# Patient Record
Sex: Male | Born: 1970 | Race: Black or African American | Hispanic: No | Marital: Married | Smoking: Current every day smoker
Health system: Southern US, Community
[De-identification: ages and names within clinical notes are randomized; demographics above are authoritative.]

## PROBLEM LIST (undated history)

## (undated) DIAGNOSIS — N200 Calculus of kidney: Secondary | ICD-10-CM

## (undated) DIAGNOSIS — N400 Enlarged prostate without lower urinary tract symptoms: Secondary | ICD-10-CM

## (undated) HISTORY — PX: KNEE ARTHROSCOPY: SUR90

---

## 2010-04-28 ENCOUNTER — Emergency Department (HOSPITAL_COMMUNITY)
Admission: EM | Admit: 2010-04-28 | Discharge: 2010-04-28 | Disposition: A | Discharge status: Home / Self Care | Payer: Self-pay | Attending: Emergency Medicine | Admitting: Emergency Medicine

## 2010-04-28 DIAGNOSIS — R209 Unspecified disturbances of skin sensation: Secondary | ICD-10-CM | POA: Insufficient documentation

## 2010-04-28 DIAGNOSIS — M62838 Other muscle spasm: Secondary | ICD-10-CM | POA: Insufficient documentation

## 2010-04-28 DIAGNOSIS — M542 Cervicalgia: Secondary | ICD-10-CM | POA: Insufficient documentation

## 2010-04-28 DIAGNOSIS — M25519 Pain in unspecified shoulder: Secondary | ICD-10-CM | POA: Insufficient documentation

## 2011-10-21 ENCOUNTER — Encounter (HOSPITAL_COMMUNITY): Payer: Self-pay

## 2011-10-21 ENCOUNTER — Emergency Department (HOSPITAL_COMMUNITY)
Admission: EM | Admit: 2011-10-21 | Discharge: 2011-10-21 | Disposition: A | Discharge status: Home / Self Care | Payer: Self-pay | Attending: Emergency Medicine | Admitting: Emergency Medicine

## 2011-10-21 DIAGNOSIS — B349 Viral infection, unspecified: Secondary | ICD-10-CM

## 2011-10-21 DIAGNOSIS — F17200 Nicotine dependence, unspecified, uncomplicated: Secondary | ICD-10-CM | POA: Insufficient documentation

## 2011-10-21 DIAGNOSIS — B9789 Other viral agents as the cause of diseases classified elsewhere: Secondary | ICD-10-CM | POA: Insufficient documentation

## 2011-10-21 LAB — CBC WITH DIFFERENTIAL/PLATELET
Basophils Relative: 0 % (ref 0–1)
Eosinophils Absolute: 0 10*3/uL (ref 0.0–0.7)
MCH: 30.8 pg (ref 26.0–34.0)
MCHC: 33.6 g/dL (ref 30.0–36.0)
Neutrophils Relative %: 83 % — ABNORMAL HIGH (ref 43–77)
Platelets: 180 10*3/uL (ref 150–400)
RBC: 4.32 MIL/uL (ref 4.22–5.81)

## 2011-10-21 MED ORDER — KETOROLAC TROMETHAMINE 30 MG/ML IJ SOLN
30.0000 mg | Freq: Once | INTRAMUSCULAR | Status: AC
  Administered 2011-10-21: 30 mg via INTRAVENOUS
  Filled 2011-10-21: qty 1

## 2011-10-21 MED ORDER — ONDANSETRON HCL 4 MG/2ML IJ SOLN
4.0000 mg | Freq: Once | INTRAMUSCULAR | Status: AC
  Administered 2011-10-21: 4 mg via INTRAVENOUS
  Filled 2011-10-21: qty 2

## 2011-10-21 MED ORDER — SODIUM CHLORIDE 0.9 % IV BOLUS (SEPSIS)
500.0000 mL | Freq: Once | INTRAVENOUS | Status: AC
  Administered 2011-10-21: 500 mL via INTRAVENOUS

## 2011-10-21 MED ORDER — PROMETHAZINE HCL 25 MG PO TABS: 25.0000 mg | ORAL_TABLET | Freq: Four times a day (QID) | ORAL | Status: DC | PRN

## 2011-10-21 MED ORDER — MORPHINE SULFATE 4 MG/ML IJ SOLN
4.0000 mg | Freq: Once | INTRAMUSCULAR | Status: AC
  Administered 2011-10-21: 4 mg via INTRAVENOUS
  Filled 2011-10-21: qty 1

## 2011-10-21 MED ORDER — OXYCODONE-ACETAMINOPHEN 5-325 MG PO TABS: 1.0000 | ORAL_TABLET | Freq: Four times a day (QID) | ORAL | Status: AC | PRN

## 2011-10-21 MED ORDER — SODIUM CHLORIDE 0.9 % IV BOLUS (SEPSIS)
1000.0000 mL | Freq: Once | INTRAVENOUS | Status: AC
  Administered 2011-10-21: 1000 mL via INTRAVENOUS

## 2011-10-21 NOTE — ED Notes (Signed)
Pt presents to ED with body aches, fever, chills and sore throat x 2 days.

## 2011-10-21 NOTE — ED Notes (Signed)
Pt d/c home in NAD. Pt voiced understanding of d/c instructions and follow up care. Pt instructed not to drive after taking phenergan/percocet.

## 2011-10-21 NOTE — ED Provider Notes (Signed)
History     CSN: [id number]  Arrival date & time 10/21/11  0820   First MD Initiated Contact with Patient 10/21/11 0840      Chief Complaint  Patient presents with  . Fever    (Consider location/radiation/quality/duration/timing/severity/associated sxs/prior treatment) Patient is a 41 y.o. male presenting with fever. The history is provided by the patient.  Fever Primary symptoms of the febrile illness include fever, fatigue and myalgias. Primary symptoms do not include headaches, shortness of breath, abdominal pain, nausea, vomiting, diarrhea or rash.  The myalgias are not associated with weakness.   patient has had fevers chills sore throat and myalgias for the last 2 days. He's had a decreased appetite. He states that he feels weak all over. He states he has vomited a little bit of blood. No clear sick contacts. No other bleeding. He has no diarrhea. He states he's been a little lightheaded. No urinary frequency.  History reviewed. No pertinent past medical history.  History reviewed. No pertinent past surgical history.  History reviewed. No pertinent family history.  History  Substance Use Topics  . Smoking status: Current Everyday Smoker  . Smokeless tobacco: Not on file  . Alcohol Use: Yes      Review of Systems  Constitutional: Positive for fever, activity change, appetite change and fatigue.  HENT: Positive for sore throat. Negative for neck stiffness.   Eyes: Negative for pain.  Respiratory: Negative for chest tightness and shortness of breath.   Cardiovascular: Negative for chest pain and leg swelling.  Gastrointestinal: Negative for nausea, vomiting, abdominal pain and diarrhea.  Genitourinary: Negative for flank pain.  Musculoskeletal: Positive for myalgias. Negative for back pain.  Skin: Negative for rash.  Neurological: Positive for light-headedness. Negative for weakness, numbness and headaches.  Psychiatric/Behavioral: Negative for behavioral problems.      Allergies  Review of patient's allergies indicates no known allergies.  Home Medications   Current Outpatient Rx  Name Route Sig Dispense Refill  . OXYCODONE-ACETAMINOPHEN 5-325 MG PO TABS Oral Take 1-2 tablets by mouth every 6 (six) hours as needed for pain. 15 tablet 0  . PROMETHAZINE HCL 25 MG PO TABS Oral Take 1 tablet (25 mg total) by mouth every 6 (six) hours as needed for nausea. 15 tablet 0    BP 131/87  Pulse 81  Temp 99.1 F (37.3 C) (Oral)  Resp 20  SpO2 98%  Physical Exam  Nursing note and vitals reviewed. Constitutional: He is oriented to person, place, and time. He appears well-developed and well-nourished.       Patient is febrile  HENT:  Head: Normocephalic and atraumatic.  Mouth/Throat: No oropharyngeal exudate.       Posterior pharyngeal erythema and edema. No exudate. Uvula is midline.`  Eyes: EOM are normal. Pupils are equal, round, and reactive to light.  Neck: Normal range of motion. Neck supple.  Cardiovascular: Normal rate, regular rhythm and normal heart sounds.   No murmur heard. Pulmonary/Chest: Effort normal and breath sounds normal.  Abdominal: Soft. Bowel sounds are normal. He exhibits no distension and no mass. There is no tenderness. There is no rebound and no guarding.  Musculoskeletal: Normal range of motion. He exhibits no edema.  Lymphadenopathy:    He has cervical adenopathy.  Neurological: He is alert and oriented to person, place, and time. No cranial nerve deficit.  Skin: Skin is warm and dry.  Psychiatric: He has a normal mood and affect.    ED Course  Procedures (including critical care  time)  Labs Reviewed  CBC WITH DIFFERENTIAL - Abnormal; Notable for the following:    WBC 12.1 (*)     Neutrophils Relative 83 (*)     Neutro Abs 10.0 (*)     Lymphocytes Relative 10 (*)     All other components within normal limits  RAPID STREP SCREEN  LAB REPORT - SCANNED   No results found.   1. Viral syndrome       MDM   Patient with fever sore throat body aches. Patient feels better after treatment. Negative strep test. He'll be discharged home with pain medication        Juliet Rude. Rubin Payor, MD 10/26/11 1610

## 2012-06-28 DIAGNOSIS — R0789 Other chest pain: Secondary | ICD-10-CM | POA: Insufficient documentation

## 2012-06-28 DIAGNOSIS — F17200 Nicotine dependence, unspecified, uncomplicated: Secondary | ICD-10-CM | POA: Insufficient documentation

## 2012-06-29 ENCOUNTER — Encounter (HOSPITAL_COMMUNITY): Payer: Self-pay | Admitting: *Deleted

## 2012-06-29 ENCOUNTER — Emergency Department (HOSPITAL_COMMUNITY)
Admission: EM | Admit: 2012-06-29 | Discharge: 2012-06-29 | Disposition: A | Discharge status: Home / Self Care | Payer: Self-pay | Attending: Emergency Medicine | Admitting: Emergency Medicine

## 2012-06-29 ENCOUNTER — Emergency Department (HOSPITAL_COMMUNITY)
Admit: 2012-06-29 | Discharge: 2012-06-29 | Disposition: A | Discharge status: Home / Self Care | Payer: Self-pay | Attending: Emergency Medicine | Admitting: Emergency Medicine

## 2012-06-29 DIAGNOSIS — R0789 Other chest pain: Secondary | ICD-10-CM

## 2012-06-29 LAB — POCT I-STAT TROPONIN I: Troponin i, poc: 0 ng/mL (ref 0.00–0.08)

## 2012-06-29 LAB — BASIC METABOLIC PANEL
CO2: 25 mEq/L (ref 19–32)
Calcium: 9.3 mg/dL (ref 8.4–10.5)
Chloride: 109 mEq/L (ref 96–112)
Sodium: 144 mEq/L (ref 135–145)

## 2012-06-29 LAB — CBC
Platelets: 197 10*3/uL (ref 150–400)
RBC: 4.39 MIL/uL (ref 4.22–5.81)
WBC: 7.2 10*3/uL (ref 4.0–10.5)

## 2012-06-29 NOTE — ED Notes (Signed)
Pt c/o left sided CP since 9 pm tonight.  Denies n/v, SOB, diaphoresis, weakness.

## 2012-06-29 NOTE — ED Notes (Signed)
Patient is denying n/v, SOB.  Patient reports decreased chest pain.
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
## 2012-10-11 ENCOUNTER — Emergency Department (INDEPENDENT_AMBULATORY_CARE_PROVIDER_SITE_OTHER)
Admission: EM | Admit: 2012-10-11 | Discharge: 2012-10-11 | Disposition: A | Discharge status: Home / Self Care | Payer: BC Managed Care – PPO | Source: Home / Self Care | Attending: Emergency Medicine | Admitting: Emergency Medicine

## 2012-10-11 ENCOUNTER — Encounter (HOSPITAL_COMMUNITY): Payer: Self-pay | Admitting: Emergency Medicine

## 2012-10-11 DIAGNOSIS — IMO0002 Reserved for concepts with insufficient information to code with codable children: Secondary | ICD-10-CM

## 2012-10-11 DIAGNOSIS — M25569 Pain in unspecified knee: Secondary | ICD-10-CM

## 2012-10-11 DIAGNOSIS — N509 Disorder of male genital organs, unspecified: Secondary | ICD-10-CM

## 2012-10-11 DIAGNOSIS — IMO0001 Reserved for inherently not codable concepts without codable children: Secondary | ICD-10-CM

## 2012-10-11 DIAGNOSIS — M25561 Pain in right knee: Secondary | ICD-10-CM

## 2012-10-11 LAB — POCT URINALYSIS DIP (DEVICE)
Glucose, UA: NEGATIVE mg/dL
Hgb urine dipstick: NEGATIVE
Nitrite: NEGATIVE
Urobilinogen, UA: 1 mg/dL (ref 0.0–1.0)

## 2012-10-11 MED ORDER — MELOXICAM 7.5 MG PO TABS: 7.5000 mg | ORAL_TABLET | Freq: Every day | ORAL | Status: DC
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
## 2012-10-11 NOTE — ED Notes (Signed)
C/o groin pain radiating down to right knee.  Denies injury.  Patient states he has always had knee trouble but last night was different.   Back and body by [employer] was used but no relief.

## 2012-10-12 ENCOUNTER — Ambulatory Visit (HOSPITAL_COMMUNITY): Payer: BC Managed Care – PPO

## 2012-10-20 NOTE — ED Notes (Signed)
rx did not transmit; called in to [facility], [place] at pt request; spoke directly w pharmacist

## 2012-10-24 ENCOUNTER — Ambulatory Visit (INDEPENDENT_AMBULATORY_CARE_PROVIDER_SITE_OTHER): Payer: BC Managed Care – PPO | Admitting: Medical

## 2012-10-24 ENCOUNTER — Encounter: Payer: Self-pay | Admitting: Medical

## 2012-10-24 VITALS — BP 130/90 | HR 76 | Temp 98.2°F | Resp 16 | Wt 256.0 lb

## 2012-10-24 DIAGNOSIS — R030 Elevated blood-pressure reading, without diagnosis of hypertension: Secondary | ICD-10-CM

## 2012-10-24 DIAGNOSIS — R3911 Hesitancy of micturition: Secondary | ICD-10-CM

## 2012-10-24 DIAGNOSIS — R39198 Other difficulties with micturition: Secondary | ICD-10-CM

## 2012-10-24 DIAGNOSIS — R102 Pelvic and perineal pain: Secondary | ICD-10-CM

## 2012-10-24 LAB — POCT URINALYSIS DIPSTICK
Spec Grav, UA: 1.01
Urobilinogen, UA: NEGATIVE
pH, UA: 5
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
## 2012-10-24 NOTE — Patient Instructions (Signed)
Benign Prostatic Hypertrophy  The prostate gland is part of the reproductive system of men. A normal prostate is about the size and shape of a walnut. The prostate gland makes a fluid that is mixed with sperm to make semen. This gland surrounds the urethra and is located in front of the rectum and just below the bladder. The bladder is where urine is stored. The urethra is the tube through which urine passes from the bladder to get out of the body. The prostate grows as a man ages. An enlarged prostate not caused by cancer is called benign prostatic hypertrophy (BPH). This is a common health problem in men over age 50. This condition is a normal part of aging. An enlarged prostate presses on the urethra. This makes it harder to pass urine. In the early stages of enlargement, the bladder can get by with a narrowed urethra by forcing the urine through. If the problem gets worse, medical or surgical treatment may be required.  This condition should be followed by your caregiver. Longstanding back pressure on the kidneys can cause infection. Back pressure and infection can progress to bladder damage and kidney (renal) failure. If needed, your caregiver may refer you to a specialist in kidney and prostate disease (urologist). CAUSES  The exact cause is not known.  SYMPTOMS   You are not able to completely empty your bladder.  Getting up often during the night to urinate.  Need to urinate frequently during the day.  Difficultly in starting urine flow.  Decrease in size and strength of the urine stream.  Dribbling after urination.  Pain on urination (more common with infection).  Inability to pass your water. This needs immediate treatment. DIAGNOSIS  These tests will help your caregiver understand your problem:  Digital rectal exam (DRE). In a rectal exam, your caregiver checks your prostate by putting a gloved, lubricated finger into the rectum to feel the back of your prostate gland. This exam  detects the size of the gland and abnormal lumps or growths.  Urinalysis (exam of the urine). This may include a culture if there is concern about infection.  Prostate Specific Antigen (PSA). This is a blood test used to screen for prostate cancer. It is not used alone for diagnosing prostate cancer.  Rectal ultrasound (sonogram). This test uses sound waves to electronically produce a "picture" of the prostate. It helps examine the prostate gland for cancer. TREATMENT  Mild symptoms may not need treatment. Simple observation and yearly exams may be all that is required. Medications and surgery are options for more severe problems. Your caregiver can help you make an informed decision for what is best. Two classes of medications are available for relief of prostate symptoms:  Medications that shrink the prostate. This helps relieve symptoms.  Uncommon side effects include problems with sexual function.  Medications to relax the muscle of the prostate. This also relieves the obstruction.  Side effects can include dizziness, fatigue, lightheadedness, and retrograde ejaculation (diminished volume of ejaculate). Several types of surgical treatments are available for relief of prostate symptoms:  Transurethral resection of the prostate (TURP). In this treatment, an instrument is inserted through opening at the tip of the penis. It is used to cut away pieces of the inner core of the prostate. The pieces are removed through the same opening of the penis. This removes the obstruction and helps get rid of the symptoms.  Transurethral incision (TUIP). In this procedure, small cuts are made in the prostate. This   lessens the prostates pressure on the urethra.  Transurethral microwave thermotherapy (TUMT). This procedure uses microwaves to create heat. The heat destroys and removes a small amount of prostate tissue.  Transurethral needle ablation (TUNA). This is a procedure that uses radio frequencies to  do the same as TUMT.  Interstitial laser coagulation (ILC). This is a procedure that uses a laser to do the same as TUMT and TUNA.  Transurethral electrovaporization (TUVP). This is a procedure that uses electrodes to do the same as the procedures listed above. Regardless of the method of treatment chosen, you and your caregiver will discuss the options. With this knowledge, you along with your caregiver can decide upon the best treatment for you. SEEK MEDICAL CARE IF:   You develop chills, fever of 100.5 F (38.1 C), or night sweats.  There is unexplained back pain.  Symptoms are not helped by medications prescribed.  You develop medication side effects.  Your urine becomes very dark or has a bad smell. SEEK IMMEDIATE MEDICAL CARE IF:   You are suddenly unable to urinate. This is an emergency. You should be seen immediately.  There are large amounts of blood or clots in the urine.  Your urinary problems become unmanageable.  You develop lightheadedness, severe dizziness, or you feel faint.  You develop moderate to severe low back or flank pain.  You develop chills or fever. Document Released: 02/15/2005 Document Revised: 05/10/2011 Document Reviewed: 11/07/2006 [facility] Patient Information 2014 [place], [place].   Pelvic Pain, Male Pelvic pain is pain below the belly button and located between your hips in the groin. Acute pain may last a few hours or days. Chronic pelvic pain may last weeks and months. The cause may be different for different types of pain. The pain may be dull or sharp, mild or severe and can interfere with your daily activities. CAUSES  Some common causes of male pelvic pain include:  A sexually transmitted disease (STD).  Inflammation of the prostate (prostatitis).  A bladder infection.  Intestinal problems, such as constipation, irritable bowel syndrome, colitis, or ileitis.  Kidney stones.  Stress or depression.  Muscle spasms or pain in the  pelvic floor muscles.  Musculoskeletal problems of the back.  A hernia.  Inflammation of the epidiymis, which sits on top of the testicle (epididymitis).  Cancer. EVALUATION  Your caregiver will want to take a careful history of your concerns. This includes recent changes in your health and a sexual history. Obtaining your family history and medical history is also important. In order to identify the cause of the pelvic pain and to be properly treated, your caregiver will perform a physical exam and may order tests. These tests may include:   Ultrasound of the prostate or scrotum.  CT scan or MRI of the pelvis or lower spine.  A urinalysis or evaluation of discharge from the penis.  Blood tests. HOME CARE INSTRUCTIONS   Only take over-the-counter or prescription medicines as directed by your caregiver.  Rest as directed by your caregiver.  Eat a balanced diet.  Drink enough fluids to make your urine clear or pale yellow, or as directed.  Avoid sexual intercourse if it causes pain.  Apply warm or cold compresses to the lower abdomen depending on which one helps the pain.  Avoid stressful situations.  Keep a journal of your pelvic pain. Write down when it started, where the pain is located, and if there are things that seem to be associated with the pain, such as  food or certain activities.  Follow up with your caregiver as directed. SEEK MEDICAL CARE IF: Your medicine does not help your pain. SEEK IMMEDIATE MEDICAL CARE IF:  Your pelvic pain increases.  You feel lightheaded or faint.  You have chills.  You have a fever or persistent symptoms for more than 3 days.  You have a fever and your symptoms suddenly get worse.  You have pain with urination or blood in your urine.  You have uncontrolled diarrhea or vomiting. MAKE SURE YOU:   Understand these instructions.  Will watch your condition.  Will get help if you are not doing well or get worse. Document  Released: 11/10/2011 Document Reviewed: 11/10/2011 [facility] Patient Information 2014 [place], [place].

## 2012-10-25 ENCOUNTER — Encounter: Payer: Self-pay | Admitting: Medical

## 2012-10-25 ENCOUNTER — Other Ambulatory Visit: Payer: Self-pay | Admitting: Medical

## 2012-10-25 LAB — PSA: PSA: 0.84 ng/mL (ref <=4.00)

## 2012-10-25 MED ORDER — TAMSULOSIN HCL 0.4 MG PO CAPS: 0.4000 mg | ORAL_CAPSULE | Freq: Every day | ORAL | Status: DC

## 2012-10-25 MED ORDER — CIPROFLOXACIN HCL 500 MG PO TABS: 500.0000 mg | ORAL_TABLET | Freq: Two times a day (BID) | ORAL | Status: DC

## 2012-11-06 ENCOUNTER — Ambulatory Visit (INDEPENDENT_AMBULATORY_CARE_PROVIDER_SITE_OTHER): Payer: BC Managed Care – PPO | Admitting: Medical

## 2012-11-06 ENCOUNTER — Encounter: Payer: Self-pay | Admitting: Medical

## 2012-11-06 VITALS — BP 140/82 | HR 80 | Temp 98.0°F | Resp 16 | Wt 260.0 lb

## 2012-11-06 DIAGNOSIS — M25569 Pain in unspecified knee: Secondary | ICD-10-CM

## 2012-11-06 DIAGNOSIS — N5314 Retrograde ejaculation: Secondary | ICD-10-CM

## 2012-11-06 DIAGNOSIS — G8929 Other chronic pain: Secondary | ICD-10-CM

## 2012-11-06 DIAGNOSIS — R030 Elevated blood-pressure reading, without diagnosis of hypertension: Secondary | ICD-10-CM

## 2012-11-06 DIAGNOSIS — R3911 Hesitancy of micturition: Secondary | ICD-10-CM

## 2012-11-06 DIAGNOSIS — N419 Inflammatory disease of prostate, unspecified: Secondary | ICD-10-CM

## 2012-11-06 MED ORDER — CIPROFLOXACIN HCL 500 MG PO TABS: 500.0000 mg | ORAL_TABLET | Freq: Two times a day (BID) | ORAL | Status: DC

## 2012-11-06 NOTE — Patient Instructions (Signed)
Prostatitis The prostate gland is about the size and shape of a walnut. It is located just below your bladder. It produces one of the components of semen, which is made up of sperm and the fluids that help nourish and transport it out from the testicles. Prostatitis is redness, soreness, and swelling (inflammation) of the prostate gland.  There are 3 types of prostatitis:  Acute bacterial prostatitis This is the least common type of prostatitis. It starts quickly and usually leads to a bladder infection. It can occur at any age.  Chronic bacterial prostatitis This is a persistent bacterial infection in the prostate. It usually develops from repeated acute bacterial prostatitis or acute bacterial prostatitis that was not properly treated. It can occur in men of any age but is most common in middle-aged men whose prostate has begun to enlarge.   Chronic prostatitis chronic pelvic pain syndrome This is the most common type of prostatitis. It is inflammation of the prostate gland that is not caused by a bacterial infection. The cause is unknown. CAUSES The cause of acute and chronic bacterial prostatitis is a bacterial infection. The exact cause of chronic prostatitis and chronic pelvic pain syndrome and asymptomatic inflammatory prostatitis is unknown.  SYMPTOMS  Symptoms can vary depending upon the type of prostatitis that exists. There can also be overlap in symptoms. Possible symptoms for each type of prostatitis are listed below. Acute bacterial prostatitis  Painful urination.  Fever or chills.  Muscle or joint pains.  Low back pain.  Low abdominal pain.  Inability to empty bladder completely.  Sudden urge to urinate.  Frequent urination.  Difficulty starting urine stream.  Weak urine stream.  Discharge from the urethra.  Dribbling after urination.  Rectal pain.  Pain in the testicles, penis, or tip of the penis.  Pain in the space between the anus and scrotum  (perineum).  Problems with sexual function.  Painful ejaculation.  Bloody semen. Chronic bacterial prostatitis  The symptoms are similar to those of acute bacterial prostatitis, but they usually are much less severe. Fever, chills, and muscle and joint pain are not associated with chronic bacterial prostatitis. Chronic prostatitis chronic pelvic pain syndrome  Symptoms typically include a dull ache in the scrotum and the perineum. DIAGNOSIS  In order to diagnose prostatitis, your caregiver will ask about your symptoms. If acute or chronic bacterial prostatitis is suspected, a urine sample will be taken and tested (urinalysis). This is to see if there is bacteria in your urine. If the urinalysis result is negative for bacteria, your caregiver may use a finger to feel your prostate (digital rectal exam). This exam helps your caregiver determine if your prostate is swollen and tender. TREATMENT  Treatment for prostatitis depends on the cause. If a bacterial infection is the cause, it can be treated with antibiotic medicine. In cases of chronic bacterial prostatitis, the use of antibiotics for up to 1 month may be necessary. Your caregiver may instruct you to take sitz baths to help relieve pain. A sitz bath is a bath of hot water in which your hips and buttocks are under water. HOME CARE INSTRUCTIONS   Take all medicines as directed by your caregiver.  Take sitz baths as directed by your caregiver. SEEK MEDICAL CARE IF:   Your symptoms get worse, not better.  You have a fever. SEEK IMMEDIATE MEDICAL CARE IF:   You have chills.  You feel nauseous or vomit.  You feel lightheaded or faint.  You are unable to  urinate.  You have blood or blood clots in your urine. Document Released: 02/13/2000 Document Revised: 05/10/2011 Document Reviewed: 01/18/2011 [facility] Patient Information 2014 [place], [place].    Benign Prostatic Hypertrophy  The prostate gland is part of the reproductive  system of men. A normal prostate is about the size and shape of a walnut. The prostate gland makes a fluid that is mixed with sperm to make semen. This gland surrounds the urethra and is located in front of the rectum and just below the bladder. The bladder is where urine is stored. The urethra is the tube through which urine passes from the bladder to get out of the body. The prostate grows as a man ages. An enlarged prostate not caused by cancer is called benign prostatic hypertrophy (BPH). This is a common health problem in men over age 48. This condition is a normal part of aging. An enlarged prostate presses on the urethra. This makes it harder to pass urine. In the early stages of enlargement, the bladder can get by with a narrowed urethra by forcing the urine through. If the problem gets worse, medical or surgical treatment may be required.  This condition should be followed by your caregiver. Longstanding back pressure on the kidneys can cause infection. Back pressure and infection can progress to bladder damage and kidney (renal) failure. If needed, your caregiver may refer you to a specialist in kidney and prostate disease (urologist). CAUSES  The exact cause is not known.  SYMPTOMS   You are not able to completely empty your bladder.  Getting up often during the night to urinate.  Need to urinate frequently during the day.  Difficultly in starting urine flow.  Decrease in size and strength of the urine stream.  Dribbling after urination.  Pain on urination (more common with infection).  Inability to pass your water. This needs immediate treatment. DIAGNOSIS  These tests will help your caregiver understand your problem:  Digital rectal exam (DRE). In a rectal exam, your caregiver checks your prostate by putting a gloved, lubricated finger into the rectum to feel the back of your prostate gland. This exam detects the size of the gland and abnormal lumps or growths.  Urinalysis  (exam of the urine). This may include a culture if there is concern about infection.  Prostate Specific Antigen (PSA). This is a blood test used to screen for prostate cancer. It is not used alone for diagnosing prostate cancer.  Rectal ultrasound (sonogram). This test uses sound waves to electronically produce a "picture" of the prostate. It helps examine the prostate gland for cancer. TREATMENT  Mild symptoms may not need treatment. Simple observation and yearly exams may be all that is required. Medications and surgery are options for more severe problems. Your caregiver can help you make an informed decision for what is best. Two classes of medications are available for relief of prostate symptoms:  Medications that shrink the prostate. This helps relieve symptoms.  Uncommon side effects include problems with sexual function.  Medications to relax the muscle of the prostate. This also relieves the obstruction.  Side effects can include dizziness, fatigue, lightheadedness, and retrograde ejaculation (diminished volume of ejaculate). Several types of surgical treatments are available for relief of prostate symptoms:  Transurethral resection of the prostate (TURP). In this treatment, an instrument is inserted through opening at the tip of the penis. It is used to cut away pieces of the inner core of the prostate. The pieces are removed through the same  opening of the penis. This removes the obstruction and helps get rid of the symptoms.  Transurethral incision (TUIP). In this procedure, small cuts are made in the prostate. This lessens the prostates pressure on the urethra.  Transurethral microwave thermotherapy (TUMT). This procedure uses microwaves to create heat. The heat destroys and removes a small amount of prostate tissue.  Transurethral needle ablation (TUNA). This is a procedure that uses radio frequencies to do the same as TUMT.  Interstitial laser coagulation (ILC). This is a  procedure that uses a laser to do the same as TUMT and TUNA.  Transurethral electrovaporization (TUVP). This is a procedure that uses electrodes to do the same as the procedures listed above. Regardless of the method of treatment chosen, you and your caregiver will discuss the options. With this knowledge, you along with your caregiver can decide upon the best treatment for you. SEEK MEDICAL CARE IF:   You develop chills, fever of 100.5 F (38.1 C), or night sweats.  There is unexplained back pain.  Symptoms are not helped by medications prescribed.  You develop medication side effects.  Your urine becomes very dark or has a bad smell. SEEK IMMEDIATE MEDICAL CARE IF:   You are suddenly unable to urinate. This is an emergency. You should be seen immediately.  There are large amounts of blood or clots in the urine.  Your urinary problems become unmanageable.  You develop lightheadedness, severe dizziness, or you feel faint.  You develop moderate to severe low back or flank pain.  You develop chills or fever. Document Released: 02/15/2005 Document Revised: 05/10/2011 Document Reviewed: 11/07/2006 [facility] Patient Information 2014 [place], [place].
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
## 2012-11-07 ENCOUNTER — Ambulatory Visit: Payer: BC Managed Care – PPO | Admitting: Medical

## 2012-11-13 ENCOUNTER — Encounter: Payer: Self-pay | Admitting: Medical

## 2012-11-27 ENCOUNTER — Ambulatory Visit: Payer: BC Managed Care – PPO | Admitting: Medical

## 2013-05-15 ENCOUNTER — Encounter: Payer: Self-pay | Admitting: Medical

## 2013-05-15 ENCOUNTER — Ambulatory Visit (INDEPENDENT_AMBULATORY_CARE_PROVIDER_SITE_OTHER): Payer: BC Managed Care – PPO | Admitting: Medical

## 2013-05-15 VITALS — BP 150/108 | HR 88 | Temp 98.0°F | Resp 16 | Wt 256.0 lb

## 2013-05-15 DIAGNOSIS — F17200 Nicotine dependence, unspecified, uncomplicated: Secondary | ICD-10-CM

## 2013-05-15 DIAGNOSIS — N419 Inflammatory disease of prostate, unspecified: Secondary | ICD-10-CM

## 2013-05-15 DIAGNOSIS — R102 Pelvic and perineal pain: Secondary | ICD-10-CM

## 2013-05-15 DIAGNOSIS — I10 Essential (primary) hypertension: Secondary | ICD-10-CM

## 2013-05-15 DIAGNOSIS — E669 Obesity, unspecified: Secondary | ICD-10-CM

## 2013-05-15 LAB — POCT URINALYSIS DIPSTICK
Bilirubin, UA: NEGATIVE
Blood, UA: NEGATIVE
Glucose, UA: NEGATIVE
Ketones, UA: NEGATIVE
LEUKOCYTES UA: NEGATIVE
NITRITE UA: NEGATIVE
PH UA: 5
PROTEIN UA: NEGATIVE
Spec Grav, UA: 1.01
UROBILINOGEN UA: NEGATIVE

## 2013-05-15 LAB — COMPREHENSIVE METABOLIC PANEL
ALBUMIN: 4.5 g/dL (ref 3.5–5.2)
ALT: 23 U/L (ref 0–53)
AST: 19 U/L (ref 0–37)
Alkaline Phosphatase: 49 U/L (ref 39–117)
BUN: 7 mg/dL (ref 6–23)
CALCIUM: 9.3 mg/dL (ref 8.4–10.5)
CHLORIDE: 107 meq/L (ref 96–112)
CO2: 27 meq/L (ref 19–32)
CREATININE: 1.17 mg/dL (ref 0.50–1.35)
Glucose, Bld: 90 mg/dL (ref 70–99)
POTASSIUM: 4.3 meq/L (ref 3.5–5.3)
Sodium: 140 mEq/L (ref 135–145)
Total Bilirubin: 0.3 mg/dL (ref 0.2–1.2)
Total Protein: 7.1 g/dL (ref 6.0–8.3)

## 2013-05-15 LAB — CBC WITH DIFFERENTIAL/PLATELET
BASOS ABS: 0 10*3/uL (ref 0.0–0.1)
BASOS PCT: 0 % (ref 0–1)
Eosinophils Absolute: 0 10*3/uL (ref 0.0–0.7)
Eosinophils Relative: 0 % (ref 0–5)
HEMATOCRIT: 43.9 % (ref 39.0–52.0)
Hemoglobin: 14.6 g/dL (ref 13.0–17.0)
LYMPHS PCT: 42 % (ref 12–46)
Lymphs Abs: 2.5 10*3/uL (ref 0.7–4.0)
MCH: 30.1 pg (ref 26.0–34.0)
MCHC: 33.3 g/dL (ref 30.0–36.0)
MCV: 90.5 fL (ref 78.0–100.0)
MONO ABS: 0.5 10*3/uL (ref 0.1–1.0)
Monocytes Relative: 8 % (ref 3–12)
NEUTROS ABS: 3 10*3/uL (ref 1.7–7.7)
NEUTROS PCT: 50 % (ref 43–77)
PLATELETS: 259 10*3/uL (ref 150–400)
RBC: 4.85 MIL/uL (ref 4.22–5.81)
RDW: 14.1 % (ref 11.5–15.5)
WBC: 6 10*3/uL (ref 4.0–10.5)

## 2013-05-15 MED ORDER — TAMSULOSIN HCL 0.4 MG PO CAPS: 0.4000 mg | ORAL_CAPSULE | Freq: Every day | ORAL | Status: DC

## 2013-05-15 MED ORDER — DOXYCYCLINE HYCLATE 100 MG PO TABS: 100.0000 mg | ORAL_TABLET | Freq: Two times a day (BID) | ORAL | Status: DC

## 2013-05-15 NOTE — Patient Instructions (Signed)
Thank you for giving me the opportunity to serve you today.    Your diagnosis today includes: Encounter Diagnoses  Name Primary?  . Perineal pain Yes  . Prostatitis   . Essential hypertension, benign   . Obesity, unspecified   . Tobacco use disorder      Specific recommendations today include:  Begin doxycycline antibiotic twice daily for 14 days for likely prostate infection  Start back on Flomax for prostate muscle relaxation  Make sure you're drinking plenty of water daily  Work on losing weight through eating a healthy low calorie diet, trying to get exercise most days of the week  Stop smoking  Follow up: pending labs, 3-4 months regarding high blood pressure   I have included other useful information below for your review.  Hypertension As your heart beats, it forces blood through your arteries. This force is your blood pressure. If the pressure is too high, it is called hypertension (HTN) or high blood pressure. HTN is dangerous because you may have it and not know it. High blood pressure may mean that your heart has to work harder to pump blood. Your arteries may be narrow or stiff. The extra work puts you at risk for heart disease, stroke, and other problems.  Blood pressure consists of two numbers, a higher number over a lower, 110/72, for example. It is stated as "110 over 72." The ideal is below 120 for the top number (systolic) and under 80 for the bottom (diastolic). Write down your blood pressure today. You should pay close attention to your blood pressure if you have certain conditions such as:  Heart failure.  Prior heart attack.  Diabetes  Chronic kidney disease.  Prior stroke.  Multiple risk factors for heart disease. To see if you have HTN, your blood pressure should be measured while you are seated with your arm held at the level of the heart. It should be measured at least twice. A one-time elevated blood pressure reading (especially in the  Emergency Department) does not mean that you need treatment. There may be conditions in which the blood pressure is different between your right and left arms. It is important to see your caregiver soon for a recheck. Most people have essential hypertension which means that there is not a specific cause. This type of high blood pressure may be lowered by changing lifestyle factors such as:  Stress.  Smoking.  Lack of exercise.  Excessive weight.  Drug/tobacco/alcohol use.  Eating less salt. Most people do not have symptoms from high blood pressure until it has caused damage to the body. Effective treatment can often prevent, delay or reduce that damage. TREATMENT  When a cause has been identified, treatment for high blood pressure is directed at the cause. There are a large number of medications to treat HTN. These fall into several categories, and your caregiver will help you select the medicines that are best for you. Medications may have side effects. You should review side effects with your caregiver. If your blood pressure stays high after you have made lifestyle changes or started on medicines,   Your medication(s) may need to be changed.  Other problems may need to be addressed.  Be certain you understand your prescriptions, and know how and when to take your medicine.  Be sure to follow up with your caregiver within the time frame advised (usually within two weeks) to have your blood pressure rechecked and to review your medications.  If you are taking more  than one medicine to lower your blood pressure, make sure you know how and at what times they should be taken. Taking two medicines at the same time can result in blood pressure that is too low. SEEK IMMEDIATE MEDICAL CARE IF:  You develop a severe headache, blurred or changing vision, or confusion.  You have unusual weakness or numbness, or a faint feeling.  You have severe chest or abdominal pain, vomiting, or breathing  problems. MAKE SURE YOU:   Understand these instructions.  Will watch your condition.  Will get help right away if you are not doing well or get worse. Document Released: 02/15/2005 Document Revised: 05/10/2011 Document Reviewed: 10/06/2007 [facility] Patient Information 2014 [place], [place].   Please try and quit smoking--start thinking about why/when you smoke (habit, boredom, stress) in order to come up with effective strategies to cut back or quit. Available resources to help you quit include free counseling through [facility] Quitline ([facility]Quitline.com or 1-800-QUITNOW), smoking cessation classes through [facility] (call to find out schedule), over-the-counter nicotine replacements, and e-cigarettes (although this may not help break the hand-mouth habit).  Many insurance companies also have smoking cessation programs (which may decrease the cost of patches, meds if enrolled).  If these methods are not effective for you, and you are motivated to quit, return to discuss the possibility of prescription medications.

## 2013-05-15 NOTE — Progress Notes (Signed)
   Subjective:   Charles Watts is a 43 y.o. male presenting on 05/15/2013 with prostrate pain  In the last few days having urinary urgency, hesitancy.  Sunday night pelvic pain came back like in the last visit for similar.   Not taking flomax.   Has had sweats one night recently not sure if fever that night.  +perineal discomfort.  Denies blood in urine, no fever, no NVD, no abdominal pain.  No scrotal or penile pain.  No scrotal swelling.  i have seen him twice in recent months for prostatitis symptoms.  Checking BPs at home.   No normal readings, all elevated.  No other aggravating or relieving factors.  No other complaint.  Review of Systems ROS as in subjective      Objective:     Filed Vitals:   05/15/13 0950  BP: 150/108  Pulse: 88  Temp: 98 F (36.7 C)  Resp: 16    General appearance: alert, no distress, WD/WN, obese AA male Neck: supple, no lymphadenopathy, no thyromegaly, no masses Heart: RRR, normal S1, S2, no murmurs Lungs: CTA bilaterally, no wheezes, rhonchi, or rales Abdomen: +bs, soft, non tender, non distended, no masses, no hepatomegaly, no splenomegaly Back: nontender Pulses: 2+ symmetric, upper and lower extremities, normal cap refill Ext: no edema DRE: anus normal tone, normal appearing, prostate seems mildly boggy and tender, no nodules      Assessment: Encounter Diagnoses  Name Primary?  . Perineal pain Yes  . Prostatitis   . Essential hypertension, benign   . Obesity, unspecified   . Tobacco use disorder      Plan: Perineal pain, likely prostatitis-restart Flomax, we'll use a 14 day course of doxycycline,  Labs today  Hypertension-we discussed the diagnosis, possible complications, will check labs today, gave option of starting medication versus lifestyle changes only, and he will use a 3-4 months trial of lifestyle changes first  Obesity-advise weight loss through healthy diet and exercise strategies  Tobacco use-advise he stop  tobacco, discussed risk of tobacco  Charles Watts was seen today for prostrate pain.  Diagnoses and associated orders for this visit:  Perineal pain - Comprehensive metabolic panel - CBC with Differential - PSA - POCT urinalysis dipstick  Prostatitis - Comprehensive metabolic panel - CBC with Differential - PSA  Essential hypertension, benign - Comprehensive metabolic panel - CBC with Differential  Obesity, unspecified  Tobacco use disorder  Other Orders - tamsulosin (FLOMAX) 0.4 MG CAPS capsule; Take 1 capsule (0.4 mg total) by mouth daily. - doxycycline (VIBRA-TABS) 100 MG tablet; Take 1 tablet (100 mg total) by mouth 2 (two) times daily.    Return pending labs.

## 2013-05-16 LAB — PSA: PSA: 0.84 ng/mL (ref <=4.00)

## 2013-05-30 ENCOUNTER — Ambulatory Visit: Payer: BC Managed Care – PPO | Admitting: Medical

## 2013-06-04 ENCOUNTER — Ambulatory Visit: Payer: BC Managed Care – PPO | Admitting: Medical

## 2014-08-13 ENCOUNTER — Other Ambulatory Visit: Payer: Self-pay | Admitting: Medical

## 2014-08-26 ENCOUNTER — Other Ambulatory Visit: Payer: Self-pay | Admitting: Family Medicine

## 2014-08-26 DIAGNOSIS — R39198 Other difficulties with micturition: Secondary | ICD-10-CM

## 2014-08-28 ENCOUNTER — Ambulatory Visit
Admission: RE | Admit: 2014-08-28 | Discharge: 2014-08-28 | Disposition: A | Discharge status: Home / Self Care | Payer: BLUE CROSS/BLUE SHIELD | Source: Ambulatory Visit | Attending: Family Medicine | Admitting: Family Medicine

## 2014-08-28 DIAGNOSIS — R39198 Other difficulties with micturition: Secondary | ICD-10-CM

## 2015-08-26 ENCOUNTER — Encounter (HOSPITAL_COMMUNITY): Payer: Self-pay | Admitting: *Deleted

## 2015-08-26 ENCOUNTER — Emergency Department (HOSPITAL_COMMUNITY)
Admission: EM | Admit: 2015-08-26 | Discharge: 2015-08-26 | Disposition: A | Discharge status: Home / Self Care | Payer: Self-pay | Attending: Emergency Medicine | Admitting: Emergency Medicine

## 2015-08-26 ENCOUNTER — Emergency Department (HOSPITAL_COMMUNITY): Payer: Self-pay

## 2015-08-26 DIAGNOSIS — N201 Calculus of ureter: Secondary | ICD-10-CM

## 2015-08-26 DIAGNOSIS — F17210 Nicotine dependence, cigarettes, uncomplicated: Secondary | ICD-10-CM | POA: Insufficient documentation

## 2015-08-26 DIAGNOSIS — N200 Calculus of kidney: Secondary | ICD-10-CM

## 2015-08-26 HISTORY — DX: Benign prostatic hyperplasia without lower urinary tract symptoms: N40.0

## 2015-08-26 LAB — COMPREHENSIVE METABOLIC PANEL
ALK PHOS: 44 U/L (ref 38–126)
ALT: 17 U/L (ref 17–63)
ANION GAP: 6 (ref 5–15)
AST: 25 U/L (ref 15–41)
Albumin: 3.8 g/dL (ref 3.5–5.0)
BILIRUBIN TOTAL: 0.6 mg/dL (ref 0.3–1.2)
BUN: 10 mg/dL (ref 6–20)
CALCIUM: 8.8 mg/dL — AB (ref 8.9–10.3)
CO2: 24 mmol/L (ref 22–32)
Chloride: 108 mmol/L (ref 101–111)
Creatinine, Ser: 1.34 mg/dL — ABNORMAL HIGH (ref 0.61–1.24)
GFR calc Af Amer: >60 mL/min (ref >60)
GLUCOSE: 114 mg/dL — AB (ref 65–99)
POTASSIUM: 3.9 mmol/L (ref 3.5–5.1)
Sodium: 138 mmol/L (ref 135–145)
TOTAL PROTEIN: 6.6 g/dL (ref 6.5–8.1)

## 2015-08-26 LAB — CBC WITH DIFFERENTIAL/PLATELET
BASOS ABS: 0 10*3/uL (ref 0.0–0.1)
BASOS PCT: 0 %
EOS ABS: 0 10*3/uL (ref 0.0–0.7)
Eosinophils Relative: 0 %
HEMATOCRIT: 40.2 % (ref 39.0–52.0)
Hemoglobin: 13.1 g/dL (ref 13.0–17.0)
Lymphocytes Relative: 41 %
Lymphs Abs: 2.1 10*3/uL (ref 0.7–4.0)
MCH: 29.5 pg (ref 26.0–34.0)
MCHC: 32.6 g/dL (ref 30.0–36.0)
MCV: 90.5 fL (ref 78.0–100.0)
MONO ABS: 0.5 10*3/uL (ref 0.1–1.0)
MONOS PCT: 9 %
NEUTROS ABS: 2.5 10*3/uL (ref 1.7–7.7)
Neutrophils Relative %: 50 %
PLATELETS: 238 10*3/uL (ref 150–400)
RBC: 4.44 MIL/uL (ref 4.22–5.81)
RDW: 13.3 % (ref 11.5–15.5)
WBC: 5.1 10*3/uL (ref 4.0–10.5)

## 2015-08-26 LAB — URINALYSIS, ROUTINE W REFLEX MICROSCOPIC
Glucose, UA: NEGATIVE mg/dL
KETONES UR: 15 mg/dL — AB
NITRITE: NEGATIVE
PROTEIN: 100 mg/dL — AB
Specific Gravity, Urine: 1.028 (ref 1.005–1.030)
pH: 5.5 (ref 5.0–8.0)

## 2015-08-26 LAB — URINE MICROSCOPIC-ADD ON

## 2015-08-26 LAB — LIPASE, BLOOD: LIPASE: 27 U/L (ref 11–51)

## 2015-08-26 MED ORDER — KETOROLAC TROMETHAMINE 30 MG/ML IJ SOLN
30.0000 mg | Freq: Once | INTRAMUSCULAR | Status: AC
  Administered 2015-08-26: 30 mg via INTRAVENOUS
  Filled 2015-08-26: qty 1

## 2015-08-26 MED ORDER — OXYCODONE-ACETAMINOPHEN 5-325 MG PO TABS: 1.0000 | ORAL_TABLET | Freq: Four times a day (QID) | ORAL | Status: DC | PRN

## 2015-08-26 MED ORDER — KETOROLAC TROMETHAMINE 30 MG/ML IJ SOLN: 60.0000 mg | Freq: Once | INTRAMUSCULAR | Status: DC

## 2015-08-26 MED ORDER — SODIUM CHLORIDE 0.9 % IV SOLN
INTRAVENOUS | Status: DC
  Administered 2015-08-26: 09:00:00 via INTRAVENOUS

## 2015-08-26 MED ORDER — NAPROXEN 500 MG PO TABS: 500.0000 mg | ORAL_TABLET | Freq: Two times a day (BID) | ORAL | Status: DC

## 2015-08-26 MED ORDER — HYDROMORPHONE HCL 1 MG/ML IJ SOLN
1.0000 mg | Freq: Once | INTRAMUSCULAR | Status: AC
  Administered 2015-08-26: 1 mg via INTRAVENOUS
  Filled 2015-08-26: qty 1

## 2015-08-26 MED ORDER — PROMETHAZINE HCL 25 MG PO TABS: 25.0000 mg | ORAL_TABLET | Freq: Four times a day (QID) | ORAL | Status: DC | PRN

## 2015-08-26 MED ORDER — ONDANSETRON HCL 4 MG/2ML IJ SOLN
4.0000 mg | Freq: Once | INTRAMUSCULAR | Status: AC
  Administered 2015-08-26: 4 mg via INTRAVENOUS
  Filled 2015-08-26: qty 2

## 2015-08-26 NOTE — ED Notes (Signed)
Transported to CT Via transporter

## 2015-08-26 NOTE — ED Notes (Signed)
Brought patient back to room; patient undressed, in gown, on continuous pulse oximetry and blood pressure cuff 

## 2015-08-26 NOTE — Discharge Instructions (Signed)
Kidney Stones °Kidney stones (urolithiasis) are deposits that form inside your kidneys. The intense pain is caused by the stone moving through the urinary tract. When the stone moves, the ureter goes into spasm around the stone. The stone is usually passed in the urine.  °CAUSES  °· A disorder that makes certain neck glands produce too much parathyroid hormone (primary hyperparathyroidism). °· A buildup of uric acid crystals, similar to gout in your joints. °· Narrowing (stricture) of the ureter. °· A kidney obstruction present at birth (congenital obstruction). °· Previous surgery on the kidney or ureters. °· Numerous kidney infections. °SYMPTOMS  °· Feeling sick to your stomach (nauseous). °· Throwing up (vomiting). °· Blood in the urine (hematuria). °· Pain that usually spreads (radiates) to the groin. °· Frequency or urgency of urination. °DIAGNOSIS  °· Taking a history and physical exam. °· Blood or urine tests. °· CT scan. °· Occasionally, an examination of the inside of the urinary bladder (cystoscopy) is performed. °TREATMENT  °· Observation. °· Increasing your fluid intake. °· Extracorporeal shock wave lithotripsy--This is a noninvasive procedure that uses shock waves to break up kidney stones. °· Surgery may be needed if you have severe pain or persistent obstruction. There are various surgical procedures. Most of the procedures are performed with the use of small instruments. Only small incisions are needed to accommodate these instruments, so recovery time is minimized. °The size, location, and chemical composition are all important variables that will determine the proper choice of action for you. Talk to your health care provider to better understand your situation so that you will minimize the risk of injury to yourself and your kidney.  °HOME CARE INSTRUCTIONS  °· Drink enough water and fluids to keep your urine clear or pale yellow. This will help you to pass the stone or stone fragments. °· Strain  all urine through the provided strainer. Keep all particulate matter and stones for your health care provider to see. The stone causing the pain may be as small as a grain of salt. It is very important to use the strainer each and every time you pass your urine. The collection of your stone will allow your health care provider to analyze it and verify that a stone has actually passed. The stone analysis will often identify what you can do to reduce the incidence of recurrences. °· Only take over-the-counter or prescription medicines for pain, discomfort, or fever as directed by your health care provider. °· Keep all follow-up visits as told by your health care provider. This is important. °· Get follow-up X-rays if required. The absence of pain does not always mean that the stone has passed. It may have only stopped moving. If the urine remains completely obstructed, it can cause loss of kidney function or even complete destruction of the kidney. It is your responsibility to make sure X-rays and follow-ups are completed. Ultrasounds of the kidney can show blockages and the status of the kidney. Ultrasounds are not associated with any radiation and can be performed easily in a matter of minutes. °· Make changes to your daily diet as told by your health care provider. You may be told to: °¨ Limit the amount of salt that you eat. °¨ Eat 5 or more servings of fruits and vegetables each day. °¨ Limit the amount of meat, poultry, fish, and eggs that you eat. °· Collect a 24-hour urine sample as told by your health care provider. You may need to collect another urine sample every 6-12   months. °SEEK MEDICAL CARE IF: °· You experience pain that is progressive and unresponsive to any pain medicine you have been prescribed. °SEEK IMMEDIATE MEDICAL CARE IF:  °· Pain cannot be controlled with the prescribed medicine. °· You have a fever or shaking chills. °· The severity or intensity of pain increases over 18 hours and is not  relieved by pain medicine. °· You develop a new onset of abdominal pain. °· You feel faint or pass out. °· You are unable to urinate. °  °This information is not intended to replace advice given to you by your health care provider. Make sure you discuss any questions you have with your health care provider. °  °Document Released: 02/15/2005 Document Revised: 11/06/2014 Document Reviewed: 07/19/2012 °[employer]vier Interactive Patient Education ©2016 [employer] ° °

## 2015-08-26 NOTE — ED Notes (Signed)
Patient is aware he needs a urine spec.

## 2015-08-26 NOTE — ED Notes (Signed)
Returned from CT.

## 2015-08-26 NOTE — ED Notes (Signed)
C/o right side pain onset this am denies n/v

## 2015-08-26 NOTE — ED Provider Notes (Signed)
CSN: [id number]     Arrival date & time 08/26/15  [id number] History   First MD Initiated Contact with Patient 08/26/15 0815     Chief Complaint  Patient presents with  . Flank Pain    HPI Patient presents to the emergency room with complaints of right-sided flank pain. Patient states he noticed the symptoms this morning when he got up. Pain is sharp in the right flank and side. The pain has been gradually increasing in intensity. He denies any trouble with nausea vomiting. No diarrhea or constipation. No dysuria or hematuria. He had not had anything to eat this morning before the pain started. He denies any chest pain or shortness of breath. The pain does not get worse with deep breathing or certain positions. Nothing seems to help. Past Medical History  Diagnosis Date  . Enlarged prostate    Past Surgical History  Procedure Laterality Date  . Knee arthroscopy     Family History  Problem Relation Age of Onset  . Cancer Mother     died of breast  . Other Father     unknown history  . Cancer Maternal Aunt     died of cervical cancer  . Cancer Maternal Uncle     prostate   Social History  Substance Use Topics  . Smoking status: Current Every Day Smoker -- 1.00 packs/day for 21 years    Types: Cigarettes  . Smokeless tobacco: None  . Alcohol Use: 4.8 oz/week    8 Shots of liquor per week    Review of Systems  All other systems reviewed and are negative.     Allergies  Review of patient's allergies indicates no known allergies.  Home Medications   Prior to Admission medications   Medication Sig Start Date End Date Taking? Authorizing Provider  naproxen (NAPROSYN) 500 MG tablet Take 1 tablet (500 mg total) by mouth 2 (two) times daily. 08/26/15   Linwood DibblesJon Jaquell Seddon, MD  oxyCODONE-acetaminophen (PERCOCET/ROXICET) 5-325 MG tablet Take 1-2 tablets by mouth every 6 (six) hours as needed. 08/26/15   Linwood DibblesJon Tauno Falotico, MD  promethazine (PHENERGAN) 25 MG tablet Take 1 tablet (25 mg total) by mouth  every 6 (six) hours as needed for nausea or vomiting. 08/26/15   Linwood DibblesJon Michelangelo Rindfleisch, MD  tamsulosin (FLOMAX) 0.4 MG CAPS capsule Take 1 capsule (0.4 mg total) by mouth daily. Patient not taking: Reported on 08/26/2015 05/15/13   Kermit Baloavid S Tysinger, PA-C   BP 152/90 mmHg  Pulse 71  Temp(Src) 98.3 F (36.8 C) (Oral)  Resp 16  Ht 5\' 11"  (1.803 m)  Wt 117.028 kg  BMI 36.00 kg/m2  SpO2 97% Physical Exam  Constitutional: He appears well-developed and well-nourished. No distress.  HENT:  Head: Normocephalic and atraumatic.  Right Ear: External ear normal.  Left Ear: External ear normal.  Eyes: Conjunctivae are normal. Right eye exhibits no discharge. Left eye exhibits no discharge. No scleral icterus.  Neck: Neck supple. No tracheal deviation present.  Cardiovascular: Normal rate, regular rhythm and intact distal pulses.   Pulmonary/Chest: Effort normal and breath sounds normal. No stridor. No respiratory distress. He has no wheezes. He has no rales.  Abdominal: Soft. Bowel sounds are normal. He exhibits no distension and no mass. There is tenderness in the right upper quadrant. There is no rigidity, no rebound and no guarding. No hernia.  Musculoskeletal: He exhibits no edema or tenderness.  Neurological: He is alert. He has normal strength. No cranial nerve deficit (no facial droop, extraocular movements  intact, no slurred speech) or sensory deficit. He exhibits normal muscle tone. He displays no seizure activity. Coordination normal.  Skin: Skin is warm and dry. No rash noted.  Psychiatric: He has a normal mood and affect.  Nursing note and vitals reviewed.   ED Course  Procedures   Medications given in the ED Medications  0.9 %  sodium chloride infusion ( Intravenous New Bag/Given 08/26/15 0847)  ondansetron (ZOFRAN) injection 4 mg (4 mg Intravenous Given 08/26/15 0848)  HYDROmorphone (DILAUDID) injection 1 mg (1 mg Intravenous Given 08/26/15 0848)  ketorolac (TORADOL) 30 MG/ML injection 30 mg (30  mg Intravenous Given 08/26/15 1045)     Labs Review Labs Reviewed  COMPREHENSIVE METABOLIC PANEL - Abnormal; Notable for the following:    Glucose, Bld 114 (*)    Creatinine, Ser 1.34 (*)    Calcium 8.8 (*)    All other components within normal limits  URINALYSIS, ROUTINE W REFLEX MICROSCOPIC (NOT [facility]) - Abnormal; Notable for the following:    Color, Urine RED (*)    APPearance TURBID (*)    Hgb urine dipstick LARGE (*)    Bilirubin Urine SMALL (*)    Ketones, ur 15 (*)    Protein, ur 100 (*)    Leukocytes, UA SMALL (*)    All other components within normal limits  URINE MICROSCOPIC-ADD ON - Abnormal; Notable for the following:    Squamous Epithelial / LPF 0-5 (*)    Bacteria, UA FEW (*)    Crystals URIC ACID CRYSTALS (*)    All other components within normal limits  LIPASE, BLOOD  CBC WITH DIFFERENTIAL/PLATELET    Imaging Review Ct Abdomen Pelvis Wo Contrast  08/26/2015  CLINICAL DATA:  Right flank pain since this a.m.  Hematuria. EXAM: CT ABDOMEN AND PELVIS WITHOUT CONTRAST TECHNIQUE: Multidetector CT imaging of the abdomen and pelvis was performed following the standard protocol without IV contrast. COMPARISON:  07/07/2015 FINDINGS: Lower chest: Lung bases are clear. No effusions. Heart is normal size. Hepatobiliary: Diffuse fatty infiltration of the liver. Gallbladder grossly unremarkable. Pancreas: No focal abnormality or ductal dilatation. Spleen: No focal abnormality.  Normal size. Adrenals/Urinary Tract: Extensive perinephric stranding. Mild right hydronephrosis. 3 mm proximal right ureteral stone. Several small nonobstructing right renal stones. The largest is in the midpole measuring 7 mm. Punctate nonobstructing midpole left renal stone. No hydronephrosis on the left. Adrenal glands and urinary bladder grossly unremarkable. Stomach/Bowel: Stomach, large and small bowel grossly unremarkable. Vascular/Lymphatic: No evidence of aneurysm or adenopathy. Reproductive: No  visible focal abnormality. Other: Trace free fluid in the pelvis.  No free air. Musculoskeletal: No acute bony abnormality. IMPRESSION: Bilateral nephrolithiasis. 3 mm proximal right ureteral stone with mild hydronephrosis and extensive perinephric stranding. Fatty infiltration of the liver. Electronically Signed   By: Charlett NoseKevin  Dover M.D.   On: 08/26/2015 12:02   I have personally reviewed and evaluated these images and lab results as part of my medical decision-making.    MDM   Final diagnoses:  Kidney stones  Right ureteral stone    The patient's CT scan confirms a 3 mm right ureteral stone. Patient was treated with pain medications in the emergency room. His symptoms are resolved now and he is feeling much better. Discussed outpatient follow-up with urology. Discharge home with prescriptions for Naprosyn, Percocet and Phenergan.    Linwood DibblesJon Iverna Hammac, MD 08/26/15 [phone]

## 2015-10-08 ENCOUNTER — Encounter (HOSPITAL_COMMUNITY): Payer: Self-pay | Admitting: *Deleted

## 2015-10-08 DIAGNOSIS — F17210 Nicotine dependence, cigarettes, uncomplicated: Secondary | ICD-10-CM | POA: Insufficient documentation

## 2015-10-08 DIAGNOSIS — R030 Elevated blood-pressure reading, without diagnosis of hypertension: Secondary | ICD-10-CM | POA: Insufficient documentation

## 2015-10-08 DIAGNOSIS — R51 Headache: Secondary | ICD-10-CM | POA: Insufficient documentation

## 2015-10-08 NOTE — ED Triage Notes (Signed)
Denies any neuro deficits.

## 2015-10-08 NOTE — ED Triage Notes (Signed)
Pt states that he has had a headache for 1 week. States his BP has also been running high. BP at home was 178/116. States he has no hx of HTN.

## 2015-10-09 ENCOUNTER — Telehealth: Payer: Self-pay | Admitting: Medical

## 2015-10-09 ENCOUNTER — Emergency Department (HOSPITAL_COMMUNITY)
Admission: EM | Admit: 2015-10-09 | Discharge: 2015-10-09 | Disposition: A | Discharge status: Home / Self Care | Payer: BLUE CROSS/BLUE SHIELD | Attending: Emergency Medicine | Admitting: Emergency Medicine

## 2015-10-09 DIAGNOSIS — R030 Elevated blood-pressure reading, without diagnosis of hypertension: Secondary | ICD-10-CM

## 2015-10-09 DIAGNOSIS — IMO0001 Reserved for inherently not codable concepts without codable children: Secondary | ICD-10-CM

## 2015-10-09 DIAGNOSIS — R519 Headache, unspecified: Secondary | ICD-10-CM

## 2015-10-09 DIAGNOSIS — R51 Headache: Secondary | ICD-10-CM

## 2015-10-09 HISTORY — DX: Calculus of kidney: N20.0

## 2015-10-09 LAB — I-STAT CHEM 8, ED
BUN: 11 mg/dL (ref 6–20)
CREATININE: 1.6 mg/dL — AB (ref 0.61–1.24)
Calcium, Ion: 1.17 mmol/L (ref 1.13–1.30)
Chloride: 104 mmol/L (ref 101–111)
Glucose, Bld: 81 mg/dL (ref 65–99)
HEMATOCRIT: 43 % (ref 39.0–52.0)
Hemoglobin: 14.6 g/dL (ref 13.0–17.0)
POTASSIUM: 3.7 mmol/L (ref 3.5–5.1)
Sodium: 142 mmol/L (ref 135–145)
TCO2: 24 mmol/L (ref 0–100)

## 2015-10-09 MED ORDER — HYDROCHLOROTHIAZIDE 12.5 MG PO TABS: 12.5000 mg | ORAL_TABLET | Freq: Every day | ORAL | 0 refills | Status: AC

## 2015-10-09 NOTE — Discharge Instructions (Signed)

## 2015-10-09 NOTE — Telephone Encounter (Signed)
pls remove me as PCP.    He went to the ED and I got copied on the report but we apparently dismissed him

## 2015-10-09 NOTE — ED Provider Notes (Signed)
[facility]-EMERGENCY DEPT Provider Note   CSN: [id number] Arrival date & time: 10/08/15  2212  By signing my name below, I, Phillis HaggisGabriella Gaje, attest that this documentation has been prepared under the direction and in the presence of Zadie Rhineonald Kamori Barbier, MD. Electronically Signed: Phillis HaggisGabriella Gaje, ED Scribe. 10/09/15. 3:39 AM.  First Provider Contact:  First MD Initiated Contact with Patient 10/09/15 0328      History   Chief Complaint Chief Complaint  Patient presents with  . Migraine  . Hypertension    The history is provided by the patient. No language interpreter was used.  Migraine  This is a new problem. The current episode started more than 2 days ago. The problem occurs constantly. The problem has been gradually worsening. Associated symptoms include headaches. Pertinent negatives include no chest pain and no abdominal pain. He has tried nothing for the symptoms.  Hypertension  This is a new problem. The current episode started yesterday. The problem occurs constantly. The problem has been gradually worsening. Associated symptoms include headaches. Pertinent negatives include no chest pain and no abdominal pain. He has tried nothing for the symptoms.  HPI Comments: Charles Watts is a 45 y.o. male who presents to the Emergency Department complaining of intermittent, gradually worsening occipital headache onset one week ago. Pt reports associated fatigue and intermittent diaphoresis. Pt states that his wife checked his BP at home and it was 178/116. He checked his BP again at work and it continued to be high, so he was told to the come to the ED. Pt states that he has never been diagnosed with HTN and does not take medications on a regular basis for his BP. He has not tried anything for his symptoms. He denies hx of stroke, hx of brain surgery, recent head injury or fall, recent tick bites, fever, chest pain, abdominal pain, vomiting, visual disturbance, rash, numbness, speech difficulty, or  weakness.   Past Medical History:  Diagnosis Date  . Enlarged prostate   . Kidney stones     There are no active problems to display for this patient.   Past Surgical History:  Procedure Laterality Date  . KNEE ARTHROSCOPY       Home Medications    Prior to Admission medications   Medication Sig Start Date End Date Taking? Authorizing Provider  acetaminophen (TYLENOL) 325 MG tablet Take 650 mg by mouth every 6 (six) hours as needed for mild pain.   Yes Historical Provider, MD  naproxen (NAPROSYN) 500 MG tablet Take 1 tablet (500 mg total) by mouth 2 (two) times daily. Patient not taking: Reported on 10/09/2015 08/26/15   Linwood DibblesJon Knapp, MD  oxyCODONE-acetaminophen (PERCOCET/ROXICET) 5-325 MG tablet Take 1-2 tablets by mouth every 6 (six) hours as needed. Patient not taking: Reported on 10/09/2015 08/26/15   Linwood DibblesJon Knapp, MD  promethazine (PHENERGAN) 25 MG tablet Take 1 tablet (25 mg total) by mouth every 6 (six) hours as needed for nausea or vomiting. Patient not taking: Reported on 10/09/2015 08/26/15   Linwood DibblesJon Knapp, MD  tamsulosin (FLOMAX) 0.4 MG CAPS capsule Take 1 capsule (0.4 mg total) by mouth daily. Patient not taking: Reported on 08/26/2015 05/15/13   Jac Canavanavid S Tysinger, PA-C    Family History Family History  Problem Relation Age of Onset  . Cancer Mother     died of breast  . Other Father     unknown history  . Cancer Maternal Aunt     died of cervical cancer  . Cancer Maternal Uncle  prostate    Social History Social History  Substance Use Topics  . Smoking status: Current Every Day Smoker    Packs/day: 1.00    Years: 21.00    Types: Cigarettes  . Smokeless tobacco: Not on file  . Alcohol use 4.8 oz/week    8 Shots of liquor per week     Allergies   Review of patient's allergies indicates no known allergies.   Review of Systems Review of Systems  Constitutional: Positive for diaphoresis and fatigue. Negative for fever.  Eyes: Negative for visual disturbance.    Cardiovascular: Negative for chest pain.  Gastrointestinal: Negative for abdominal pain and vomiting.  Skin: Negative for rash.  Neurological: Positive for headaches. Negative for speech difficulty, weakness and numbness.  All other systems reviewed and are negative.   Physical Exam Updated Vital Signs BP (!) 180/112 (BP Location: Right Arm)   Pulse 79   Temp 98 F (36.7 C) (Oral)   Resp 18   Ht 5\' 11"  (1.803 m)   SpO2 100%   Physical Exam CONSTITUTIONAL: Well developed/well nourished HEAD: Normocephalic/atraumatic EYES: EOMI/PERRL, no nystagmus, no ptosis, normal fundoscopic exam (no papilledema)  ENMT: Mucous membranes moist NECK: supple no meningeal signs, no bruits SPINE/BACK:entire spine nontender CV: S1/S2 noted, no murmurs/rubs/gallops noted LUNGS: Lungs are clear to auscultation bilaterally, no apparent distress ABDOMEN: soft, nontender, no rebound or guarding GU:no cva tenderness NEURO:Awake/alert, face symmetric, no arm or leg drift is noted Equal 5/5 strength with shoulder abduction, elbow flex/extension, wrist flex/extension in upper extremities and equal hand grips bilaterally Equal 5/5 strength with hip flexion,knee flex/extension, foot dorsi/plantar flexion Cranial nerves 3/4/5/6/09/06/08/11/12 tested and intact Gait normal without ataxia No past pointing Sensation to light touch intact in all extremities EXTREMITIES: pulses normal, full ROM SKIN: warm, color normal PSYCH: no abnormalities of mood noted, alert and oriented to situation  ED Treatments / Results  DIAGNOSTIC STUDIES: Oxygen Saturation is 100% on RA, normal by my interpretation.    COORDINATION OF CARE: 3:35 AM-Discussed treatment plan which includes IV fluids, labs, and BP medication with pt at bedside and pt agreed to plan. Pt requests a work note.    Labs (all labs ordered are listed, but only abnormal results are displayed) Labs Reviewed  I-STAT CHEM 8, ED - Abnormal; Notable for the  following:       Result Value   Creatinine, Ser 1.60 (*)    All other components within normal limits    EKG  EKG Interpretation None       Radiology No results found.  Procedures Procedures (including critical care time)  Medications Ordered in ED Medications - No data to display   Initial Impression / Assessment and Plan / ED Course  I have reviewed the triage vital signs and the nursing notes.  Pertinent labs & imaging results that were available during my care of the patient were reviewed by me and considered in my medical decision making (see chart for details).  Clinical Course    Pt declines pain meds He reports he is already improving I doubt SAH or other acute neurologic emergency Review of chart he is usually hypertensive on previous ED visits Will start HCTZ He needs PCP f/u and discussed renal insufficiency We also discussed return precautions   Final Clinical Impressions(s) / ED Diagnoses   Final diagnoses:  Elevated blood pressure  Bad headache  I personally performed the services described in this documentation, which was scribed in my presence. The recorded information has  been reviewed and is accurate.       New Prescriptions Discharge Medication List as of 10/09/2015  4:08 AM    START taking these medications   Details  hydrochlorothiazide (HYDRODIURIL) 12.5 MG tablet Take 1 tablet (12.5 mg total) by mouth daily., Starting Thu 10/09/2015, Print         Zadie Rhine, MD 10/09/15 [phone]

## 2016-11-26 ENCOUNTER — Encounter (HOSPITAL_COMMUNITY): Payer: Self-pay | Admitting: *Deleted

## 2016-11-26 ENCOUNTER — Emergency Department (HOSPITAL_COMMUNITY)
Admission: EM | Admit: 2016-11-26 | Discharge: 2016-11-26 | Disposition: A | Discharge status: Home / Self Care | Payer: BLUE CROSS/BLUE SHIELD | Attending: Emergency Medicine | Admitting: Emergency Medicine

## 2016-11-26 DIAGNOSIS — F17210 Nicotine dependence, cigarettes, uncomplicated: Secondary | ICD-10-CM | POA: Insufficient documentation

## 2016-11-26 DIAGNOSIS — R3121 Asymptomatic microscopic hematuria: Secondary | ICD-10-CM | POA: Insufficient documentation

## 2016-11-26 DIAGNOSIS — N400 Enlarged prostate without lower urinary tract symptoms: Secondary | ICD-10-CM | POA: Insufficient documentation

## 2016-11-26 DIAGNOSIS — Z79899 Other long term (current) drug therapy: Secondary | ICD-10-CM | POA: Insufficient documentation

## 2016-11-26 LAB — URINALYSIS, ROUTINE W REFLEX MICROSCOPIC
BILIRUBIN URINE: NEGATIVE
Bacteria, UA: NONE SEEN
Glucose, UA: NEGATIVE mg/dL
Ketones, ur: NEGATIVE mg/dL
LEUKOCYTES UA: NEGATIVE
NITRITE: NEGATIVE
Protein, ur: 30 mg/dL — AB
Specific Gravity, Urine: 1.02 (ref 1.005–1.030)
Squamous Epithelial / LPF: NONE SEEN
pH: 7 (ref 5.0–8.0)

## 2016-11-26 MED ORDER — TAMSULOSIN HCL 0.4 MG PO CAPS: 0.4000 mg | ORAL_CAPSULE | Freq: Every day | ORAL | 0 refills | Status: AC

## 2016-11-26 NOTE — ED Provider Notes (Signed)
[facility]-EMERG[facility]Y DEPT Provider Note   CSN: [id number] Arrival date & time: 11/26/16  1000     History   Chief Complaint Chief Complaint  Patient presents with  . Urinary Frequency    HPI Charles Watts is a 46 y.o. male.  46 year old male history of enlarged prostate and kidney stones who presents with painless hematuria.  Onset of gross hematuria during work.  Patient presented for conern of hematuria.  Denies previous episodes.  No current pain suggestive of kidney stones.  Has previously taken Flomax for enlarged prostate.  Lost to follow-up due to insurance.  Endorses nocturnal urinary frequency.  Denies fevers, dysuria, penile discharge, rash, ulcers. No saddle anesthesias  The history is provided by the patient.    Past Medical History:  Diagnosis Date  . Enlarged prostate   . Kidney stones     There are no active problems to display for this patient.   Past Surgical History:  Procedure Laterality Date  . KNEE ARTHROSCOPY         Home Medications    Prior to Admission medications   Medication Sig Start Date End Date Taking? Authorizing Provider  hydrochlorothiazide (HYDRODIURIL) 12.5 MG tablet Take 1 tablet (12.5 mg total) by mouth daily. 10/09/15   Zadie Rhine, MD  tamsulosin (FLOMAX) 0.4 MG CAPS capsule Take 1 capsule (0.4 mg total) by mouth daily. 11/26/16   Hebert Soho, MD    Family History Family History  Problem Relation Age of Onset  . Cancer Mother        died of breast  . Other Father        unknown history  . Cancer Maternal Aunt        died of cervical cancer  . Cancer Maternal Uncle        prostate    Social History Social History  Substance Use Topics  . Smoking status: Current Every Day Smoker    Packs/day: 1.00    Years: 21.00    Types: Cigarettes  . Smokeless tobacco: Never Used  . Alcohol use 4.8 oz/week    8 Shots of liquor per week     Allergies   Patient has no known allergies.   Review of Systems Review of Systems   Constitutional: Negative for chills and fever.  HENT: Negative for ear pain and sore throat.   Eyes: Negative for pain and visual disturbance.  Respiratory: Negative for cough and shortness of breath.   Cardiovascular: Negative for chest pain and palpitations.  Gastrointestinal: Negative for abdominal pain and vomiting.  Genitourinary: Positive for frequency (nocturnal) and hematuria. Negative for difficulty urinating, dysuria and genital sores.  Musculoskeletal: Negative for arthralgias and back pain.  Skin: Negative for color change and rash.  Neurological: Negative for seizures and syncope.  All other systems reviewed and are negative.    Physical Exam Updated Vital Signs BP (!) 157/103 (BP Location: Right Arm)   Pulse 79   Temp 98.5 F (36.9 C) (Oral)   Resp 16   SpO2 97%   Physical Exam  Constitutional: He appears well-developed and well-nourished.  HENT:  Head: Normocephalic and atraumatic.  Eyes: Conjunctivae are normal.  Neck: Neck supple.  Cardiovascular: Normal rate and regular rhythm.   No murmur heard. Pulmonary/Chest: Effort normal and breath sounds normal. No respiratory distress.  Abdominal: Soft. There is no tenderness.  Genitourinary: Penis normal. No penile tenderness.  Musculoskeletal: He exhibits no edema.  Neurological: He is alert.  Skin: Skin is warm and dry.  Nursing note and vitals reviewed.    ED Treatments / Results  Labs (all labs ordered are listed, but only abnormal results are displayed) Labs Reviewed  URINALYSIS, ROUTINE W REFLEX MICROSCOPIC - Abnormal; Notable for the following:       Result Value   APPearance HAZY (*)    Hgb urine dipstick LARGE (*)    Protein, ur 30 (*)    All other components within normal limits    EKG  EKG Interpretation None       Radiology No results found.  Procedures Procedures (including critical care time)  Medications Ordered in ED Medications - No data to display   Initial Impression  / Assessment and Plan / ED Course  I have reviewed the triage vital signs and the nursing notes.  Pertinent labs & imaging results that were available during my care of the patient were reviewed by me and considered in my medical decision making (see chart for details).     35 yoM h/o enlarged prostate who p/w asymptomatic hematuria and nocturnal urinary frequency. No penile lesions or discharge noted on exam. No testicular pain or swelling. Visual hematuria resolved on arrival. AF, VSS.  UA showing microscopic large Hgb. No urinary glucose or signs of UTI.    No recent trauma or signs of infection. Possible prostate or bladder source of bleeding. No reported relation of hematuria with urinary stream. Increased nocturnal urinary frequency possible relation to enlarge prostate. Will resume previously prescribed flomax. Hypertension noted, patient declines previous HCTZ as it made him feel bad. He will discuss continued antihypertensive with new PCP. Stable for close PCP f/u and continued evaluation.  Return precautions provided for worsening symptoms. Pt will f/u with PCP at first availability. Pt verbalized agreement with plan.  Pt care d/w Dr. Jeraldine Loots  Final Clinical Impressions(s) / ED Diagnoses   Final diagnoses:  Asymptomatic microscopic hematuria  Enlarged prostate    New Prescriptions Discharge Medication List as of 11/26/2016  3:08 PM    START taking these medications   Details  tamsulosin (FLOMAX) 0.4 MG CAPS capsule Take 1 capsule (0.4 mg total) by mouth daily., Starting Fri 11/26/2016, Print         Hebert Soho, MD 11/27/16 5621    Gerhard Munch, MD 11/27/16 2352

## 2016-11-26 NOTE — ED Notes (Signed)
Pt ambulatory to and from bathroom to void, specimen obtained, pt tolerated well.

## 2016-11-26 NOTE — ED Triage Notes (Signed)
Pt states urinary frequency times one week and states he has enlarged prostate.  Pt complains of left groin pain and today saw blood in his urine and just feels tired.

## 2016-11-26 NOTE — Discharge Instructions (Signed)
Please followup with [facility] at first availability. Please talk with your primary doctor after followup with Urology for continued symptoms.

## 2016-12-30 ENCOUNTER — Emergency Department (HOSPITAL_COMMUNITY)
Admission: EM | Admit: 2016-12-30 | Discharge: 2016-12-30 | Disposition: A | Discharge status: Home / Self Care | Payer: BLUE CROSS/BLUE SHIELD | Attending: Emergency Medicine | Admitting: Emergency Medicine

## 2016-12-30 ENCOUNTER — Encounter (HOSPITAL_COMMUNITY): Payer: Self-pay | Admitting: *Deleted

## 2016-12-30 DIAGNOSIS — Z5321 Procedure and treatment not carried out due to patient leaving prior to being seen by health care provider: Secondary | ICD-10-CM | POA: Insufficient documentation

## 2016-12-30 LAB — COMPREHENSIVE METABOLIC PANEL
ALBUMIN: 3.9 g/dL (ref 3.5–5.0)
ALK PHOS: 55 U/L (ref 38–126)
ALT: 20 U/L (ref 17–63)
ANION GAP: 9 (ref 5–15)
AST: 22 U/L (ref 15–41)
BILIRUBIN TOTAL: 0.6 mg/dL (ref 0.3–1.2)
BUN: 9 mg/dL (ref 6–20)
CO2: 22 mmol/L (ref 22–32)
Calcium: 9 mg/dL (ref 8.9–10.3)
Chloride: 105 mmol/L (ref 101–111)
Creatinine, Ser: 1.62 mg/dL — ABNORMAL HIGH (ref 0.61–1.24)
GFR calc Af Amer: 58 mL/min — ABNORMAL LOW (ref >60)
GFR calc non Af Amer: 50 mL/min — ABNORMAL LOW (ref >60)
GLUCOSE: 123 mg/dL — AB (ref 65–99)
Potassium: 4.2 mmol/L (ref 3.5–5.1)
Sodium: 136 mmol/L (ref 135–145)
TOTAL PROTEIN: 7.2 g/dL (ref 6.5–8.1)

## 2016-12-30 LAB — URINALYSIS, ROUTINE W REFLEX MICROSCOPIC
Bacteria, UA: NONE SEEN
Bilirubin Urine: NEGATIVE
GLUCOSE, UA: NEGATIVE mg/dL
HGB URINE DIPSTICK: NEGATIVE
Ketones, ur: NEGATIVE mg/dL
Leukocytes, UA: NEGATIVE
NITRITE: NEGATIVE
PH: 7 (ref 5.0–8.0)
Protein, ur: 30 mg/dL — AB
Specific Gravity, Urine: 1.02 (ref 1.005–1.030)
Squamous Epithelial / LPF: NONE SEEN

## 2016-12-30 LAB — CBC
HCT: 43.5 % (ref 39.0–52.0)
HEMOGLOBIN: 14.2 g/dL (ref 13.0–17.0)
MCH: 30.2 pg (ref 26.0–34.0)
MCHC: 32.6 g/dL (ref 30.0–36.0)
MCV: 92.6 fL (ref 78.0–100.0)
PLATELETS: 238 10*3/uL (ref 150–400)
RBC: 4.7 MIL/uL (ref 4.22–5.81)
RDW: 13.3 % (ref 11.5–15.5)
WBC: 8.6 10*3/uL (ref 4.0–10.5)

## 2016-12-30 LAB — LIPASE, BLOOD: Lipase: 25 U/L (ref 11–51)

## 2016-12-30 NOTE — ED Triage Notes (Signed)
The pt is c/o generalized weakness body aches  sorethroat headache chills sinc e last pm temp unknown

## 2016-12-30 NOTE — ED Notes (Signed)
Per charge pt LWBS 

## 2017-04-10 ENCOUNTER — Other Ambulatory Visit: Payer: Self-pay

## 2017-04-10 ENCOUNTER — Emergency Department (HOSPITAL_COMMUNITY): Payer: 59

## 2017-04-10 ENCOUNTER — Encounter (HOSPITAL_COMMUNITY): Payer: Self-pay

## 2017-04-10 ENCOUNTER — Emergency Department (HOSPITAL_COMMUNITY)
Admission: EM | Admit: 2017-04-10 | Discharge: 2017-04-10 | Disposition: A | Discharge status: Home / Self Care | Payer: 59 | Attending: Physician Assistant | Admitting: Physician Assistant

## 2017-04-10 DIAGNOSIS — R079 Chest pain, unspecified: Secondary | ICD-10-CM | POA: Diagnosis not present

## 2017-04-10 DIAGNOSIS — F17210 Nicotine dependence, cigarettes, uncomplicated: Secondary | ICD-10-CM | POA: Diagnosis not present

## 2017-04-10 DIAGNOSIS — Z79899 Other long term (current) drug therapy: Secondary | ICD-10-CM | POA: Insufficient documentation

## 2017-04-10 LAB — CBC
HEMATOCRIT: 42.6 % (ref 39.0–52.0)
HEMOGLOBIN: 13.9 g/dL (ref 13.0–17.0)
MCH: 30.8 pg (ref 26.0–34.0)
MCHC: 32.6 g/dL (ref 30.0–36.0)
MCV: 94.5 fL (ref 78.0–100.0)
Platelets: 221 10*3/uL (ref 150–400)
RBC: 4.51 MIL/uL (ref 4.22–5.81)
RDW: 13.8 % (ref 11.5–15.5)
WBC: 7.2 10*3/uL (ref 4.0–10.5)

## 2017-04-10 LAB — BASIC METABOLIC PANEL
Anion gap: 11 (ref 5–15)
BUN: 9 mg/dL (ref 6–20)
CHLORIDE: 106 mmol/L (ref 101–111)
CO2: 23 mmol/L (ref 22–32)
Calcium: 9 mg/dL (ref 8.9–10.3)
Creatinine, Ser: 1.42 mg/dL — ABNORMAL HIGH (ref 0.61–1.24)
GFR calc Af Amer: >60 mL/min (ref >60)
GFR, EST NON AFRICAN AMERICAN: 59 mL/min — AB (ref >60)
GLUCOSE: 74 mg/dL (ref 65–99)
Potassium: 4.4 mmol/L (ref 3.5–5.1)
Sodium: 140 mmol/L (ref 135–145)

## 2017-04-10 LAB — I-STAT TROPONIN, ED
Troponin i, poc: 0 ng/mL (ref 0.00–0.08)
Troponin i, poc: 0 ng/mL (ref 0.00–0.08)

## 2017-04-10 MED ORDER — ASPIRIN 81 MG PO CHEW
324.0000 mg | CHEWABLE_TABLET | Freq: Once | ORAL | Status: AC
  Administered 2017-04-10: 324 mg via ORAL
  Filled 2017-04-10: qty 4

## 2017-04-10 NOTE — ED Triage Notes (Signed)
Per Pt, Pt is coming from work with complaints of left sided chest pain that started about an hour ago while he was at work. Denies any N/V/D or SOB.

## 2017-04-10 NOTE — ED Provider Notes (Signed)
[facility] EMERGENCY DEPARTMENT Provider Note   CSN: [id number] Arrival date & time: 04/10/17  1558     History   Chief Complaint Chief Complaint  Patient presents with  . Chest Pain    HPI Charles Watts is a 47 y.o. male.  HPI   Patient is a 47 year old male presenting with chest pain.  He reports that the chest pain started approximately 2 hours ago.  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  He reports that just an hour ago he felt like it radiated a little bit to his back.  Now has gone away.  Patient is currently chest pain-free.  He has past medical history significant for hypertension.  He denies known history of hyperlipidemia or diabetes.  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  Patient denies radiation of pain to either shoulders or jaw.  No shortness of breath.  Patient did have a moment of diaphoresis before the chest pain came.  He reports he has had one anxiety attack before but never had pain like this previously.    Past Medical History:  Diagnosis Date  . Enlarged prostate   . Kidney stones     There are no active problems to display for this patient.   Past Surgical History:  Procedure Laterality Date  . KNEE ARTHROSCOPY         Home Medications    Prior to Admission medications   Medication Sig Start Date End Date Taking? Authorizing Provider  hydrochlorothiazide (HYDRODIURIL) 12.5 MG tablet Take 1 tablet (12.5 mg total) by mouth daily. 10/09/15   Zadie Rhine, MD  tamsulosin (FLOMAX) 0.4 MG CAPS capsule Take 1 capsule (0.4 mg total) by mouth daily. 11/26/16   Hebert Soho, MD    Family History Family History  Problem Relation Age of Onset  . Cancer Mother        died of breast  . Other Father        unknown history  . Cancer Maternal Aunt        died of cervical cancer  . Cancer Maternal Uncle        prostate    Social History Social History   Tobacco Use  . Smoking status: Current Every  Day Smoker    Packs/day: 1.00    Years: 21.00    Pack years: 21.00    Types: Cigarettes  . Smokeless tobacco: Never Used  Substance Use Topics  . Alcohol use: Yes    Alcohol/week: 4.8 oz    Types: 8 Shots of liquor per week  . Drug use: Yes    Types: Marijuana     Allergies   Patient has no known allergies.   Review of Systems Review of Systems  Constitutional: Negative for activity change, fatigue and fever.  Respiratory: Negative for shortness of breath.   Cardiovascular: Positive for chest pain.  Gastrointestinal: Negative for abdominal pain.     Physical Exam Updated Vital Signs BP (!) 175/101 (BP Location: Left Arm)   Pulse 80   Temp 98.5 F (36.9 C) (Oral)   Resp 18   Ht 5\' 10"  (1.778 m)   Wt 114.3 kg (252 lb)   SpO2 99%   BMI 36.16 kg/m   Physical Exam  Constitutional: He is oriented to person, place, and time. He appears well-nourished.  HENT:  Head: Normocephalic.  Eyes: Conjunctivae are normal.  Cardiovascular: Normal rate, regular rhythm, intact distal pulses and normal pulses.  No murmur  heard. Pulmonary/Chest: Effort normal and breath sounds normal. He has no decreased breath sounds.  Musculoskeletal:       Right lower leg: He exhibits no edema.  Neurological: He is oriented to person, place, and time.  Skin: Skin is warm and dry. He is not diaphoretic.  Psychiatric: He has a normal mood and affect. His behavior is normal.  Nursing note and vitals reviewed.    ED Treatments / Results  Labs (all labs ordered are listed, but only abnormal results are displayed) Labs Reviewed  BASIC METABOLIC PANEL - Abnormal; Notable for the following components:      Result Value   Creatinine, Ser 1.42 (*)    GFR calc non Af Amer 59 (*)    All other components within normal limits  CBC  I-STAT TROPONIN, ED  I-STAT TROPONIN, ED    EKG  EKG Interpretation  Date/Time:  Sunday April 10 2017 19:56:36 EST Ventricular Rate:  81 PR  Interval:  158 QRS Duration: 82 QT Interval:  374 QTC Calculation: 435 R Axis:   4 Text Interpretation:  Sinus rhythm Low voltage, precordial leads ST elev, probable normal early repol pattern No significant change since last tracing Confirmed by Bary Castilla ([id number]) on 04/10/2017 7:58:50 PM       Radiology Dg Chest 2 View  Result Date: 04/10/2017 CLINICAL DATA:  Pt complaints of left sided chest pain that started about 2 hours ago while he was at work. Denies any N/V/D or SOB. Reports pain is now also on right upper back area. Smoker. Hx of HTN but not on meds EXAM: CHEST  2 VIEW COMPARISON:  07/07/2015 FINDINGS: The heart size and mediastinal contours are within normal limits. Both lungs are clear. No pleural effusion or pneumothorax. The visualized skeletal structures are unremarkable. IMPRESSION: No active cardiopulmonary disease. Electronically Signed   By: Amie Portland M.D.   On: 04/10/2017 16:39    Procedures Procedures (including critical care time)  Medications Ordered in ED Medications  aspirin chewable tablet 324 mg (324 mg Oral Given 04/10/17 1722)     Initial Impression / Assessment and Plan / ED Course  I have reviewed the triage vital signs and the nursing notes.  Pertinent labs & imaging results that were available during my care of the patient were reviewed by me and considered in my medical decision making (see chart for details).     Patient is a 47 year old male presenting with chest pain.  He reports that the chest pain started approximately 2 hours ago.  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  He reports that just an hour ago he felt like it radiated a little bit to his back.  Now has gone away.  Patient is currently chest pain-free.  He has past medical history significant for hypertension.  He denies known history of hyperlipidemia or diabetes.  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  Patient denies radiation of pain to either  shoulders or jaw.  No shortness of breath.  Patient did have a moment of diaphoresis before the chest pain came.  He reports he has had one anxiety attack before but never had pain like this previously.  11:54 PM Very well-appearing, no acute distress, no pain currently.  Heart score of 2, will do delta troponin.  Deltas engative. Repeat EKG unchanged, will encourage follow up with primary, cards.   Final Clinical Impressions(s) / ED Diagnoses   Final diagnoses:  Chest pain, unspecified type  ED Discharge Orders    None       Abelino DerrickMackuen, Eamonn Sermeno Lyn, MD 04/10/17 2355

## 2017-04-10 NOTE — Discharge Instructions (Signed)
We are unsure what caused her chest pain today.  Your markers here were reassuring.  We want you to follow-up with your primary care.  You likely need to be on medications for your high blood pressure, but we want you to measure it again as an outpatient.  Please return with any chest pain or other concerns.

## 2018-04-16 ENCOUNTER — Encounter (HOSPITAL_COMMUNITY): Payer: Self-pay | Admitting: Emergency Medicine

## 2018-04-16 ENCOUNTER — Emergency Department (HOSPITAL_COMMUNITY): Payer: 59

## 2018-04-16 ENCOUNTER — Emergency Department (HOSPITAL_COMMUNITY)
Admission: EM | Admit: 2018-04-16 | Discharge: 2018-04-16 | Disposition: A | Discharge status: Home / Self Care | Payer: 59 | Attending: Emergency Medicine | Admitting: Emergency Medicine

## 2018-04-16 DIAGNOSIS — Z79899 Other long term (current) drug therapy: Secondary | ICD-10-CM | POA: Diagnosis not present

## 2018-04-16 DIAGNOSIS — Z87442 Personal history of urinary calculi: Secondary | ICD-10-CM | POA: Diagnosis not present

## 2018-04-16 DIAGNOSIS — R109 Unspecified abdominal pain: Secondary | ICD-10-CM

## 2018-04-16 DIAGNOSIS — R1032 Left lower quadrant pain: Secondary | ICD-10-CM | POA: Diagnosis not present

## 2018-04-16 DIAGNOSIS — F17210 Nicotine dependence, cigarettes, uncomplicated: Secondary | ICD-10-CM | POA: Diagnosis not present

## 2018-04-16 LAB — COMPREHENSIVE METABOLIC PANEL
ALBUMIN: 3.7 g/dL (ref 3.5–5.0)
ALK PHOS: 46 U/L (ref 38–126)
ALT: 29 U/L (ref 0–44)
AST: 23 U/L (ref 15–41)
Anion gap: 8 (ref 5–15)
BILIRUBIN TOTAL: 0.7 mg/dL (ref 0.3–1.2)
BUN: 7 mg/dL (ref 6–20)
CALCIUM: 8.9 mg/dL (ref 8.9–10.3)
CO2: 23 mmol/L (ref 22–32)
CREATININE: 1.17 mg/dL (ref 0.61–1.24)
Chloride: 109 mmol/L (ref 98–111)
Glucose, Bld: 155 mg/dL — ABNORMAL HIGH (ref 70–99)
Potassium: 4.3 mmol/L (ref 3.5–5.1)
SODIUM: 140 mmol/L (ref 135–145)
TOTAL PROTEIN: 7.3 g/dL (ref 6.5–8.1)

## 2018-04-16 LAB — CBC WITH DIFFERENTIAL/PLATELET
Abs Immature Granulocytes: 0.02 10*3/uL (ref 0.00–0.07)
BASOS ABS: 0 10*3/uL (ref 0.0–0.1)
Basophils Relative: 1 %
EOS ABS: 0.1 10*3/uL (ref 0.0–0.5)
EOS PCT: 1 %
HEMATOCRIT: 43.8 % (ref 39.0–52.0)
Hemoglobin: 13.8 g/dL (ref 13.0–17.0)
Immature Granulocytes: 0 %
Lymphocytes Relative: 45 %
Lymphs Abs: 2.4 10*3/uL (ref 0.7–4.0)
MCH: 29.4 pg (ref 26.0–34.0)
MCHC: 31.5 g/dL (ref 30.0–36.0)
MCV: 93.4 fL (ref 80.0–100.0)
Monocytes Absolute: 0.5 10*3/uL (ref 0.1–1.0)
Monocytes Relative: 9 %
NRBC: 0 % (ref 0.0–0.2)
Neutro Abs: 2.4 10*3/uL (ref 1.7–7.7)
Neutrophils Relative %: 44 %
Platelets: 189 10*3/uL (ref 150–400)
RBC: 4.69 MIL/uL (ref 4.22–5.81)
RDW: 13.2 % (ref 11.5–15.5)
WBC: 5.4 10*3/uL (ref 4.0–10.5)

## 2018-04-16 LAB — URINALYSIS, ROUTINE W REFLEX MICROSCOPIC
Bilirubin Urine: NEGATIVE
GLUCOSE, UA: NEGATIVE mg/dL
Hgb urine dipstick: NEGATIVE
KETONES UR: NEGATIVE mg/dL
LEUKOCYTE UA: NEGATIVE
NITRITE: NEGATIVE
PROTEIN: NEGATIVE mg/dL
Specific Gravity, Urine: 1.02 (ref 1.005–1.030)
pH: 5 (ref 5.0–8.0)

## 2018-04-16 MED ORDER — IOHEXOL 300 MG/ML  SOLN
100.0000 mL | Freq: Once | INTRAMUSCULAR | Status: AC | PRN
  Administered 2018-04-16: 100 mL via INTRAVENOUS

## 2018-04-16 MED ORDER — KETOROLAC TROMETHAMINE 30 MG/ML IJ SOLN
30.0000 mg | Freq: Once | INTRAMUSCULAR | Status: AC
  Administered 2018-04-16: 30 mg via INTRAVENOUS
  Filled 2018-04-16: qty 1

## 2018-04-16 MED ORDER — ACETAMINOPHEN 500 MG PO TABS
1000.0000 mg | ORAL_TABLET | Freq: Once | ORAL | Status: AC
  Administered 2018-04-16: 1000 mg via ORAL
  Filled 2018-04-16: qty 2

## 2018-04-16 MED ORDER — SODIUM CHLORIDE 0.9 % IV BOLUS
1000.0000 mL | Freq: Once | INTRAVENOUS | Status: AC
  Administered 2018-04-16: 1000 mL via INTRAVENOUS

## 2018-04-16 NOTE — ED Notes (Signed)
Pt returns from ct  

## 2018-04-16 NOTE — ED Provider Notes (Signed)
[facility]   CSN: [id number] Arrival date & time: 04/16/18  [id number]     History   Chief Complaint Chief Complaint  Patient presents with  . Flank Pain    HPI Charles Watts is a 48 y.o. male.  HPI   Charles SitesRondale Katrinka Watts is a 48 y.o. male, with a history of prostatic hypertrophy and kidney stones, presenting to the ED with left flank pain beginning 2 days ago.  States pain is constant, "feels like someone punched me in the side a few times," rated 8/10, radiating towards the left lower quadrant.  Worsens with movement. He states it feels "a little similar to a kidney stone."  Last kidney stone was about a year ago. Denies fever/chills, falls/trauma, shortness of breath, chest pain, cough, hematochezia/melena, N/V/C/D, dysuria, hematuria, penile discharge, groin pain, or any other complaints.   Past Medical History:  Diagnosis Date  . Enlarged prostate   . Kidney stones     There are no active problems to display for this patient.   Past Surgical History:  Procedure Laterality Date  . KNEE ARTHROSCOPY          Home Medications    Prior to Admission medications   Medication Sig Start Date End Date Taking? Authorizing Provider  hydrochlorothiazide (HYDRODIURIL) 12.5 MG tablet Take 1 tablet (12.5 mg total) by mouth daily. 10/09/15   Zadie RhineWickline, Donald, MD  tamsulosin (FLOMAX) 0.4 MG CAPS capsule Take 1 capsule (0.4 mg total) by mouth daily. 11/26/16   Hebert SohoMu, Frank, MD    Family History Family History  Problem Relation Age of Onset  . Cancer Mother        died of breast  . Other Father        unknown history  . Cancer Maternal Aunt        died of cervical cancer  . Cancer Maternal Uncle        prostate    Social History Social History   Tobacco Use  . Smoking status: Current Every Day Smoker    Packs/day: 1.00    Years: 21.00    Pack years: 21.00    Types: Cigarettes  . Smokeless tobacco: Never Used  Substance Use  Topics  . Alcohol use: Yes    Alcohol/week: 8.0 standard drinks    Types: 8 Shots of liquor per week  . Drug use: Yes    Types: Marijuana     Allergies   Patient has no known allergies.   Review of Systems Review of Systems  Constitutional: Negative for chills, diaphoresis and fever.  Respiratory: Negative for shortness of breath.   Cardiovascular: Negative for chest pain.  Gastrointestinal: Negative for blood in stool, constipation, diarrhea, nausea and vomiting.  Genitourinary: Positive for flank pain. Negative for discharge, dysuria, hematuria, scrotal swelling and testicular pain.  Musculoskeletal: Negative for back pain.  Neurological: Negative for weakness and numbness.  All other systems reviewed and are negative.    Physical Exam Updated Vital Signs BP (!) 150/115   Pulse 91   Temp 98.3 F (36.8 C) (Oral)   Resp 15   SpO2 99%   Physical Exam Vitals signs and nursing note reviewed.  Constitutional:      General: He is not in acute distress.    Appearance: He is well-developed. He is not diaphoretic.  HENT:     Head: Normocephalic and atraumatic.     Mouth/Throat:     Mouth: Mucous membranes are moist.  Pharynx: Oropharynx is clear.  Eyes:     Conjunctiva/sclera: Conjunctivae normal.  Neck:     Musculoskeletal: Neck supple.  Cardiovascular:     Rate and Rhythm: Normal rate and regular rhythm.     Pulses: Normal pulses.          Posterior tibial pulses are 2+ on the right side and 2+ on the left side.     Heart sounds: Normal heart sounds.  Pulmonary:     Effort: Pulmonary effort is normal. No respiratory distress.     Breath sounds: Normal breath sounds.  Abdominal:     Palpations: Abdomen is soft.     Tenderness: There is no abdominal tenderness. There is left CVA tenderness. There is no guarding.     Comments: Tenderness to left flank, but no tenderness to the anterior abdomen.  Musculoskeletal:     Right lower leg: No edema.     Left lower  leg: No edema.  Lymphadenopathy:     Cervical: No cervical adenopathy.  Skin:    General: Skin is warm and dry.  Neurological:     Mental Status: He is alert.     Comments: Sensation grossly intact to light touch in the lower extremities bilaterally. No saddle anesthesias. Strength 5/5 in the bilateral lower extremities. No noted gait deficit.  Psychiatric:        Mood and Affect: Mood and affect normal.        Speech: Speech normal.        Behavior: Behavior normal.      ED Treatments / Results  Labs (all labs ordered are listed, but only abnormal results are displayed) Labs Reviewed  COMPREHENSIVE METABOLIC PANEL - Abnormal; Notable for the following components:      Result Value   Glucose, Bld 155 (*)    All other components within normal limits  URINE CULTURE  URINALYSIS, ROUTINE W REFLEX MICROSCOPIC  CBC WITH DIFFERENTIAL/PLATELET    EKG None  Radiology Ct Abdomen Pelvis W Contrast  Result Date: 04/16/2018 CLINICAL DATA:  Left flank pain. EXAM: CT ABDOMEN AND PELVIS WITH CONTRAST TECHNIQUE: Multidetector CT imaging of the abdomen and pelvis was performed using the standard protocol following bolus administration of intravenous contrast. CONTRAST:  OMNIPAQUE IOHEXOL 300 MG/ML  SOLN COMPARISON:  August 26, 2015 FINDINGS: Lower chest: No acute abnormality. Hepatobiliary: Hepatic steatosis. The liver, gallbladder, and portal vein are otherwise normal. Pancreas: Unremarkable. No pancreatic ductal dilatation or surrounding inflammatory changes. Spleen: Normal in size without focal abnormality. Adrenals/Urinary Tract: Bilateral renal stones are seen with 1 stone seen on the right and 3 stones seen on the left. A representative mid pole stone on the left measures 2.7 mm on coronal image 100. No hydronephrosis or perinephric stranding. No renal masses. No ureterectasis or ureteral stones. The bladder is normal. Stomach/Bowel: The stomach and small bowel are normal. Scattered  colonic diverticuli are seen without diverticulitis. The colon is otherwise normal. The appendix is normal. Vascular/Lymphatic: No significant vascular findings are present. No enlarged abdominal or pelvic lymph nodes. Reproductive: Prostate is unremarkable. Other: No abdominal wall hernia or abnormality. No abdominopelvic ascites. Musculoskeletal: No acute or significant osseous findings. IMPRESSION: 1. Nonobstructive stones in both kidneys. 2. No cause for left lower quadrant pain identified. Electronically Signed   By: Gerome Sam III M.D   On: 04/16/2018 10:41    Procedures Procedures (including critical care time)  Medications Ordered in ED Medications  sodium chloride 0.9 % bolus 1,000 mL (0  mLs Intravenous Stopped 04/16/18 0951)  ketorolac (TORADOL) 30 MG/ML injection 30 mg (30 mg Intravenous Given 04/16/18 0750)  iohexol (OMNIPAQUE) 300 MG/ML solution 100 mL (100 mLs Intravenous Contrast Given 04/16/18 0957)  acetaminophen (TYLENOL) tablet 1,000 mg (1,000 mg Oral Given 04/16/18 1017)     Initial Impression / Assessment and Plan / ED Course  I have reviewed the triage vital signs and the nursing notes.  Pertinent labs & imaging results that were available during my care of the patient were reviewed by me and considered in my medical decision making (see chart for details).     Patient presents with left flank pain for the last 2 days. Patient is nontoxic appearing, afebrile, not tachycardic, not tachypneic, not hypotensive, maintains excellent SPO2 on room air, and is in no apparent distress.  No leukocytosis.  No abnormalities noted on UA.  Other lab work reassuring. My suspicion for AAA or dissection is low based on patient appearance and presentation, physical exam findings, and duration of symptoms. CT without acute abnormality. The patient was given instructions for home care as well as return precautions. Patient voices understanding of these instructions, accepts the plan, and  is comfortable with discharge.  Patient's hypertension is noted.  He does not seem to be symptomatic to it at this time.  Doubt hypertensive emergency.  Vitals:   04/16/18 0719 04/16/18 1011  BP: (!) 150/115 (!) 153/107  Pulse: 91 71  Resp: 15 20  Temp: 98.3 F (36.8 C)   TempSrc: Oral   SpO2: 99% 99%     Final Clinical Impressions(s) / ED Diagnoses   Final diagnoses:  Flank pain    ED Discharge Orders    None       Concepcion Living 04/16/18 1140    Tegeler, Canary Brim, MD 04/16/18 1208

## 2018-04-16 NOTE — ED Notes (Signed)
Pt provided urine cup and made aware of need for urine sample. Ambulated to restroom with steady gait and nad.

## 2018-04-16 NOTE — Discharge Instructions (Addendum)
The lab and CT results were reassuring today.  Follow-up with a primary care provider or urologist on this matter.  Antiinflammatory medications: Take 600 mg of ibuprofen every 6 hours or 440 mg (over the counter dose) to 500 mg (prescription dose) of naproxen every 12 hours for the next 3 days. After this time, these medications may be used as needed for pain. Take these medications with food to avoid upset stomach. Choose only one of these medications, do not take them together. Acetaminophen (generic for Tylenol): Should you continue to have additional pain while taking the ibuprofen or naproxen, you may add in acetaminophen as needed. Your daily total maximum amount of acetaminophen from all sources should be limited to 4000mg /day for persons without liver problems, or 2000mg /day for those with liver problems.  Your blood pressure was higher than normal today.  Please follow-up with a primary care provider on this matter.

## 2018-04-16 NOTE — ED Triage Notes (Addendum)
Pt reports left flank pain since Friday as well as urinary frequency. Hx of kidney stones. Denies hematuria, n/v/d. Pain worse with movement/ambulation. Took tylenol last night with some relief.

## 2018-04-16 NOTE — ED Notes (Signed)
Patient transported to CT 

## 2018-04-17 LAB — URINE CULTURE: CULTURE: NO GROWTH

## 2018-05-15 ENCOUNTER — Telehealth (HOSPITAL_COMMUNITY): Payer: Self-pay | Admitting: Emergency Medicine

## 2018-05-15 ENCOUNTER — Other Ambulatory Visit: Payer: Self-pay

## 2018-05-15 ENCOUNTER — Emergency Department (HOSPITAL_COMMUNITY)
Admission: EM | Admit: 2018-05-15 | Discharge: 2018-05-15 | Disposition: A | Discharge status: Home / Self Care | Payer: 59 | Attending: Emergency Medicine | Admitting: Emergency Medicine

## 2018-05-15 ENCOUNTER — Encounter (HOSPITAL_COMMUNITY): Payer: Self-pay | Admitting: Emergency Medicine

## 2018-05-15 DIAGNOSIS — J029 Acute pharyngitis, unspecified: Secondary | ICD-10-CM

## 2018-05-15 DIAGNOSIS — F17210 Nicotine dependence, cigarettes, uncomplicated: Secondary | ICD-10-CM | POA: Diagnosis not present

## 2018-05-15 DIAGNOSIS — I10 Essential (primary) hypertension: Secondary | ICD-10-CM | POA: Diagnosis not present

## 2018-05-15 DIAGNOSIS — B9789 Other viral agents as the cause of diseases classified elsewhere: Secondary | ICD-10-CM

## 2018-05-15 DIAGNOSIS — J069 Acute upper respiratory infection, unspecified: Secondary | ICD-10-CM | POA: Diagnosis not present

## 2018-05-15 DIAGNOSIS — Z79899 Other long term (current) drug therapy: Secondary | ICD-10-CM | POA: Insufficient documentation

## 2018-05-15 LAB — GROUP A STREP BY PCR: Group A Strep by PCR: DETECTED — AB

## 2018-05-15 MED ORDER — CLINDAMYCIN HCL 150 MG PO CAPS: 450.0000 mg | ORAL_CAPSULE | Freq: Three times a day (TID) | ORAL | 0 refills | Status: AC

## 2018-05-15 MED ORDER — PREDNISONE 10 MG PO TABS: 40.0000 mg | ORAL_TABLET | Freq: Every day | ORAL | 0 refills | Status: AC

## 2018-05-15 NOTE — Telephone Encounter (Signed)
2322: Rapid strep positive. RN Selena Batten notified patient via phone call. Rx for clindamycin and prednisone will be sent to his pharmacy. Pt aware. He will come to ER for work note.

## 2018-05-15 NOTE — ED Triage Notes (Signed)
Pt denies international travel or exposure to anyone who has been sick. NAD noted

## 2018-05-15 NOTE — ED Provider Notes (Signed)
[facility] EMERGENCY DEPARTMENT Provider Note   CSN: [id number] Arrival date & time: 05/15/18  1844    History   Chief Complaint Chief Complaint  Patient presents with   URI    HPI Charles Watts is a 48 y.o. male with history of hypertension, kidney stones is here for evaluation of sore throat.  Onset 2 days ago.  Began having a dry cough today.  Recurrent cold sweat at work and his boss told him to come to the ER.  Other associated symptoms include rhinorrhea, nausea.  No known sick contacts.  No recent travel.  Denies associated trismus, changes to his voice fever, chest pain, shortness of breath, vomiting, diarrhea, abdominal pain.  No dimensions.  He is requesting a work note so he can return to work.      HPI  Past Medical History:  Diagnosis Date   Enlarged prostate    Kidney stones     There are no active problems to display for this patient.   Past Surgical History:  Procedure Laterality Date   KNEE ARTHROSCOPY          Home Medications    Prior to Admission medications   Medication Sig Start Date End Date Taking? Authorizing Provider  hydrochlorothiazide (HYDRODIURIL) 12.5 MG tablet Take 1 tablet (12.5 mg total) by mouth daily. 10/09/15   Zadie Rhine, MD  tamsulosin (FLOMAX) 0.4 MG CAPS capsule Take 1 capsule (0.4 mg total) by mouth daily. 11/26/16   Hebert Soho, MD    Family History Family History  Problem Relation Age of Onset   Cancer Mother        died of breast   Other Father        unknown history   Cancer Maternal Aunt        died of cervical cancer   Cancer Maternal Uncle        prostate    Social History Social History   Tobacco Use   Smoking status: Current Every Day Smoker    Packs/day: 1.00    Years: 21.00    Pack years: 21.00    Types: Cigarettes   Smokeless tobacco: Never Used  Substance Use Topics   Alcohol use: Yes    Alcohol/week: 8.0 standard drinks    Types: 8 Shots of liquor per week     Drug use: Yes    Types: Marijuana     Allergies   Patient has no known allergies.   Review of Systems Review of Systems  Constitutional: Positive for diaphoresis.  HENT: Positive for sore throat.   Respiratory: Positive for cough.   Gastrointestinal: Positive for nausea.  All other systems reviewed and are negative.    Physical Exam Updated Vital Signs BP (!) 161/105 (BP Location: Right Arm)    Pulse 78    Temp 98.6 F (37 C) (Oral)    Resp 18    Ht 5\' 11"  (1.803 m)    Wt 113.4 kg    SpO2 96%    BMI 34.87 kg/m   Physical Exam Vitals signs and nursing note reviewed.  Constitutional:      General: He is not in acute distress.    Appearance: He is well-developed.     Comments: NAD.  HENT:     Head: Normocephalic and atraumatic.     Right Ear: External ear normal.     Left Ear: External ear normal.     Nose: Congestion present.     Comments: Mild  mucosal edema, erythema.  No rhinorrhea.    Mouth/Throat:     Pharynx: Posterior oropharyngeal erythema present.     Comments: Diffuse beefy red erythema to the oropharynx and tonsils.  1+ symmetrically enlarged tonsils without any exudates.  Uvula is midline.  Moist mucous membranes.  No trismus.  Tolerating secretions.  Normal phonation.  No sublingual fullness. Eyes:     General: No scleral icterus.    Conjunctiva/sclera: Conjunctivae normal.  Neck:     Musculoskeletal: Normal range of motion and neck supple.     Comments: No cervical lymphadenopathy. Cardiovascular:     Rate and Rhythm: Normal rate and regular rhythm.     Heart sounds: Normal heart sounds. No murmur.  Pulmonary:     Effort: Pulmonary effort is normal.     Breath sounds: Normal breath sounds. No wheezing.     Comments: Sounds congested.  Normal work of breathing. Musculoskeletal: Normal range of motion.        General: No deformity.  Skin:    General: Skin is warm and dry.     Capillary Refill: Capillary refill takes less than 2 seconds.   Neurological:     Mental Status: He is alert and oriented to person, place, and time.  Psychiatric:        Behavior: Behavior normal.        Thought Content: Thought content normal.        Judgment: Judgment normal.      ED Treatments / Results  Labs (all labs ordered are listed, but only abnormal results are displayed) Labs Reviewed  GROUP A STREP BY PCR    EKG None  Radiology No results found.  Procedures Procedures (including critical care time)  Medications Ordered in ED Medications - No data to display   Initial Impression / Assessment and Plan / ED Course  I have reviewed the triage vital signs and the nursing notes.  Pertinent labs & imaging results that were available during my care of the patient were reviewed by me and considered in my medical decision making (see chart for details).       48 year old here with sore throat.  Associated with nausea, cough.  DDX includes strep versus viral pharyngitis versus other URI.  No concerning physical findings such as asymmetric tonsillar hypertrophy, uvula deviation, muffled voice, trismus, drooling.  Low suspicion for deep neck space infection, RPA, Ludwig's, PTA.  Doubt epiglottitis.  I recommended rapid strep swab.  Patient states that he needed to leave to help his wife who locked herself out of the house.  I do not think further emergent lab work or imaging is indicated today.  He was discharged pending strep swab results.  I will follow-up and contact him if these results are positive and sent a prescription to his pharmacy if needed.  Patient has access to my chart and can follow-up on his results as well.  Return precautions were given.  He is comfortable with this plan.  Final Clinical Impressions(s) / ED Diagnoses   Final diagnoses:  Viral URI with cough  Sore throat    ED Discharge Orders    None       Jerrell Mylar 05/15/18 2126    Margarita Grizzle, MD 05/20/18 [phone]

## 2018-05-15 NOTE — Discharge Instructions (Signed)
You were seen in the ER for sore throat.  We swabbed your throat to rule out strep pharyngitis.  You had an emergency and decided to be discharged before the results came back.  I will follow-up on the results, I will call you if the results are positive and send a prescription to your pharmacy for antibiotics.  If the result is normal, you will not get a call from [place].  You can follow-up on MyChart to check your results.   You are cleared to return to work as long as you have no fever.  If you wake up in the morning and have a oral temperature of 100.9F or greater you should stay home.   Overall, your symptoms sound to be related to a virus. A viral illness typically peaks on day 2-3 and resolves after one week.      The main treatment approach for a viral upper respiratory infection is to treat the symptoms, support your immune system and prevent spread of illness.    Stay well-hydrated. Rest. You can use over the counter medications to help with symptoms: 600 mg ibuprofen (motrin, aleve, advil) or acetaminophen (tylenol) every 6 hours, around the clock to help with associated fevers, sore throat, headaches, generalized body aches and malaise.  Oxymetazoline (afrin) intranasal spray once daily for no more than 3 days to help with congestion, after 3 days you can switch to another over-the-counter nasal steroid spray such as fluticasone (flonase) Allergy medication (loratadine, cetirizine, etc) and phenylephrine (sudafed) help with nasal congestion, runny nose and postnasal drip.   Dextromethorphan (Delsym) to suppress dry cough  Guaifenesin (mucinex) to help with built up mucus in chest and productive cough Wash your hands often to prevent spread.    A viral upper respiratory infection can also worsen and progress into pneumonia.  Monitor your symptoms. Return for persistent fevers, chest pain, productive cough, inability to tolerate fluids despite nausea medicines or dehydration

## 2018-05-15 NOTE — ED Triage Notes (Signed)
Pt reports URI, cough, sore throat, and generalized fatigue that started today, pt sent by his work.

## 2020-09-10 ENCOUNTER — Other Ambulatory Visit: Payer: Self-pay

## 2020-09-10 ENCOUNTER — Emergency Department (HOSPITAL_COMMUNITY)
Admission: EM | Admit: 2020-09-10 | Discharge: 2020-09-10 | Disposition: A | Discharge status: Home / Self Care | Payer: 59 | Attending: Emergency Medicine | Admitting: Emergency Medicine

## 2020-09-10 ENCOUNTER — Emergency Department (HOSPITAL_COMMUNITY): Payer: 59

## 2020-09-10 DIAGNOSIS — R079 Chest pain, unspecified: Secondary | ICD-10-CM | POA: Diagnosis present

## 2020-09-10 DIAGNOSIS — Z79899 Other long term (current) drug therapy: Secondary | ICD-10-CM | POA: Diagnosis not present

## 2020-09-10 DIAGNOSIS — N289 Disorder of kidney and ureter, unspecified: Secondary | ICD-10-CM | POA: Diagnosis not present

## 2020-09-10 DIAGNOSIS — F17210 Nicotine dependence, cigarettes, uncomplicated: Secondary | ICD-10-CM | POA: Insufficient documentation

## 2020-09-10 DIAGNOSIS — R0789 Other chest pain: Secondary | ICD-10-CM

## 2020-09-10 DIAGNOSIS — I10 Essential (primary) hypertension: Secondary | ICD-10-CM | POA: Insufficient documentation

## 2020-09-10 LAB — CBC
HCT: 43.5 % (ref 39.0–52.0)
Hemoglobin: 14.2 g/dL (ref 13.0–17.0)
MCH: 30.8 pg (ref 26.0–34.0)
MCHC: 32.6 g/dL (ref 30.0–36.0)
MCV: 94.4 fL (ref 80.0–100.0)
Platelets: 218 10*3/uL (ref 150–400)
RBC: 4.61 MIL/uL (ref 4.22–5.81)
RDW: 13.2 % (ref 11.5–15.5)
WBC: 10.1 10*3/uL (ref 4.0–10.5)
nRBC: 0 % (ref 0.0–0.2)

## 2020-09-10 LAB — TROPONIN I (HIGH SENSITIVITY)
Troponin I (High Sensitivity): 9 ng/L (ref <18)
Troponin I (High Sensitivity): 7 ng/L (ref <18)

## 2020-09-10 LAB — BASIC METABOLIC PANEL
Anion gap: 8 (ref 5–15)
BUN: 9 mg/dL (ref 6–20)
CO2: 23 mmol/L (ref 22–32)
Calcium: 9.2 mg/dL (ref 8.9–10.3)
Chloride: 109 mmol/L (ref 98–111)
Creatinine, Ser: 1.35 mg/dL — ABNORMAL HIGH (ref 0.61–1.24)
GFR, Estimated: >60 mL/min (ref >60)
Glucose, Bld: 109 mg/dL — ABNORMAL HIGH (ref 70–99)
Potassium: 4.1 mmol/L (ref 3.5–5.1)
Sodium: 140 mmol/L (ref 135–145)

## 2020-09-10 LAB — D-DIMER, QUANTITATIVE: D-Dimer, Quant: <0.27 ug/mL-FEU (ref 0.00–0.50)

## 2020-09-10 MED ORDER — ASPIRIN 81 MG PO CHEW
324.0000 mg | CHEWABLE_TABLET | Freq: Once | ORAL | Status: AC
  Administered 2020-09-10: 324 mg via ORAL
  Filled 2020-09-10: qty 4

## 2020-09-10 MED ORDER — HYDROCODONE-ACETAMINOPHEN 5-325 MG PO TABS
1.0000 | ORAL_TABLET | Freq: Once | ORAL | Status: AC
  Administered 2020-09-10: 1 via ORAL
  Filled 2020-09-10: qty 1

## 2020-09-10 MED ORDER — KETOROLAC TROMETHAMINE 15 MG/ML IJ SOLN
15.0000 mg | Freq: Once | INTRAMUSCULAR | Status: AC
  Administered 2020-09-10: 15 mg via INTRAVENOUS
  Filled 2020-09-10: qty 1

## 2020-09-10 NOTE — ED Provider Notes (Signed)
Emergency Medicine Provider Triage Evaluation Note  Charles Watts , a 50 y.o. male  was evaluated in triage.  Pt complains of chest pain that started this morning.  Gradually worsening.  Reports associated SOB and diaphoresis.  Denies fever, but has had chills.  Review of Systems  Positive: CP, SOB Negative: Fever, coiugh  Physical Exam  There were no vitals taken for this visit. Gen:   Awake, no distress   Resp:  Normal effort  MSK:   Moves extremities without difficulty  Other:    Medical Decision Making  Medically screening exam initiated at 12:47 AM.  Appropriate orders placed.  Charles Watts was informed that the remainder of the evaluation will be completed by another provider, this initial triage assessment does not replace that evaluation, and the importance of remaining in the ED until their evaluation is complete.  Chest pain   Roxy Horseman, PA-C 09/10/20 0049    Dione Booze, MD 09/10/20 [phone]

## 2020-09-10 NOTE — ED Provider Notes (Signed)
[facility] EMERGENCY DEPARTMENT Provider Note   CSN: [id number] Arrival date & time: 09/10/20  0041     History Chief Complaint  Patient presents with   Chest Pain    Charles Watts is a 50 y.o. male.  The history is provided by the patient.  Chest Pain He has history of hypertension, prostatic hypertrophy and comes in because of left-sided chest pain which started this morning when he woke up.  Pain initially was in the left upper chest, then moved to the left lateral chest, and now has settled in the left lower chest.  Pain is described as a dull, ache and he rates it at 10/10.  It is worse with taking a deep breath, and worse with movement.  He denies any recent trauma or unusual activity.  He denies dyspnea, nausea, diaphoresis.  He tried using a heating pad at home without relief.  He has not taken any medications.  He is a cigarette smoker but denies history of hyperlipidemia.  He does relate history of borderline diabetes.  There is no known family history of premature coronary atherosclerosis.  He denies any recent trauma and there is no history of cancer or surgery.   Past Medical History:  Diagnosis Date   Enlarged prostate    Kidney stones     There are no problems to display for this patient.   Past Surgical History:  Procedure Laterality Date   KNEE ARTHROSCOPY         Family History  Problem Relation Age of Onset   Cancer Mother        died of breast   Other Father        unknown history   Cancer Maternal Aunt        died of cervical cancer   Cancer Maternal Uncle        prostate    Social History   Tobacco Use   Smoking status: Every Day    Packs/day: 1.00    Years: 21.00    Pack years: 21.00    Types: Cigarettes   Smokeless tobacco: Never  Substance Use Topics   Alcohol use: Yes    Alcohol/week: 8.0 standard drinks    Types: 8 Shots of liquor per week   Drug use: Yes    Types: Marijuana    Home Medications Prior to  Admission medications   Medication Sig Start Date End Date Taking? Authorizing Provider  hydrochlorothiazide (HYDRODIURIL) 12.5 MG tablet Take 1 tablet (12.5 mg total) by mouth daily. 10/09/15   Zadie Rhine, MD  tamsulosin (FLOMAX) 0.4 MG CAPS capsule Take 1 capsule (0.4 mg total) by mouth daily. 11/26/16   Hebert Soho, MD    Allergies    Patient has no known allergies.  Review of Systems   Review of Systems  Cardiovascular:  Positive for chest pain.  All other systems reviewed and are negative.  Physical Exam Updated Vital Signs BP (!) 182/122 (BP Location: Right Arm)   Pulse 90   Temp 99.4 F (37.4 C)   Resp (!) 22   Ht 5\' 11"  (1.803 m)   Wt 119.7 kg   SpO2 97%   BMI 36.82 kg/m   Physical Exam Vitals and nursing note reviewed.  50 year old male, resting comfortably and in no acute distress. Vital signs are significant for elevated blood pressure and respiratory rate. Oxygen saturation is 97%, which is normal. Head is normocephalic and atraumatic. PERRLA, EOMI. Oropharynx is clear. Neck  is nontender and supple without adenopathy or JVD. Back is nontender and there is no CVA tenderness. Lungs are clear without rales, wheezes, or rhonchi. Chest is tender diffusely throughout the left anterior chest wall.  There is no crepitus. Heart has regular rate and rhythm without murmur. Abdomen is soft, flat, nontender without masses or hepatosplenomegaly and peristalsis is normoactive. Extremities have no cyanosis or edema, full range of motion is present. Skin is warm and dry without rash. Neurologic: Mental status is normal, cranial nerves are intact, there are no motor or sensory deficits.  ED Results / Procedures / Treatments   Labs (all labs ordered are listed, but only abnormal results are displayed) Labs Reviewed  BASIC METABOLIC PANEL - Abnormal; Notable for the following components:      Result Value   Glucose, Bld 109 (*)    Creatinine, Ser 1.35 (*)    All other  components within normal limits  CBC  D-DIMER, QUANTITATIVE  TROPONIN I (HIGH SENSITIVITY)  TROPONIN I (HIGH SENSITIVITY)    EKG EKG Interpretation  Date/Time:  Wednesday September 10 2020 00:51:57 EDT Ventricular Rate:  95 PR Interval:  170 QRS Duration: 74 QT Interval:  340 QTC Calculation: 427 R Axis:   -5 Text Interpretation: Normal sinus rhythm Normal ECG When compared with ECG of 04/10/2017, No significant change was found Confirmed by Dione Booze ([id number]) on 09/10/2020 2:19:36 AM  Radiology DG Chest 2 View  Result Date: 09/10/2020 CLINICAL DATA:  Chest pain for 1 day EXAM: CHEST - 2 VIEW COMPARISON:  04/10/2017 FINDINGS: Cardiac shadow is stable. The lungs are well aerated bilaterally. No focal infiltrate or effusion is seen. Mild degenerative changes of the thoracic spine are noted. IMPRESSION: No acute abnormality noted. Electronically Signed   By: Alcide Clever M.D.   On: 09/10/2020 01:59    Procedures Procedures   Medications Ordered in ED Medications  aspirin chewable tablet 324 mg (324 mg Oral Given 09/10/20 0058)  aspirin chewable tablet 324 mg (324 mg Oral Given 09/10/20 0301)  ketorolac (TORADOL) 15 MG/ML injection 15 mg (15 mg Intravenous Given 09/10/20 0302)    ED Course  I have reviewed the triage vital signs and the nursing notes.  Pertinent labs & imaging results that were available during my care of the patient were reviewed by me and considered in my medical decision making (see chart for details).   MDM Rules/Calculators/A&P                         Chest pain which seems to be chest wall pain based on history and physical exam.  ECG shows no acute changes.  Chest x-ray is normal.  Labs show mild elevation of creatinine which had been present previously.  Initial troponin is normal.  Given pleuritic nature of pain, will check D-dimer as well as repeat troponin.  He is given a dose of aspirin and ketorolac.  Ketorolac doses reduced in light of mild renal  insufficiency.  Old records are reviewed, and he does have a prior ED visit for chest pain.  Heart score is 2, which places him at low risk for major adverse cardiac events in the next 6 weeks.  Repeat troponin is normal, D-dimer is normal.  He got good pain relief with ketorolac.  He is advised to use ice, take over-the-counter NSAIDs and acetaminophen for pain.  Recommended that he monitor his blood pressure at home and follow-up with PCP to make any appropriate  medication adjustments.  Final Clinical Impression(s) / ED Diagnoses Final diagnoses:  Chest wall pain  Renal insufficiency  Elevated blood pressure reading with diagnosis of hypertension    Rx / DC Orders ED Discharge Orders     None        Dione Booze, MD 09/10/20 [phone]

## 2020-09-10 NOTE — Discharge Instructions (Addendum)
Apply ice for 30 minutes at a time, 4 times a day.  Take ibuprofen and/or acetaminophen as needed for pain.  Please note that taking both medications together gives you better pain relief than either one by itself.  Your blood pressure was high while you are in the emergency department.  Please monitor it at home.  Make sure that you are taking all of your medications as prescribed.  If your blood pressure continues to be high at home, your primary care provider will need to make some changes so that your blood pressure is better controlled.  Poorly controlled blood pressure leads to heart attacks, strokes, kidney failure.

## 2020-09-10 NOTE — ED Triage Notes (Addendum)
Pt c/o left side chest pain starting today while getting out of bed, with radiation to left arm, SOB, and diaphoresis. No cardiac HX. Pt clenching his left side of his chest with labored breathing in triage.

## 2022-02-15 ENCOUNTER — Other Ambulatory Visit: Payer: Self-pay

## 2022-02-15 ENCOUNTER — Emergency Department (HOSPITAL_COMMUNITY)
Admission: EM | Admit: 2022-02-15 | Discharge: 2022-02-15 | Discharge status: Against Medical Advice | Payer: 59 | Attending: Emergency Medicine | Admitting: Emergency Medicine

## 2022-02-15 DIAGNOSIS — Z5321 Procedure and treatment not carried out due to patient leaving prior to being seen by health care provider: Secondary | ICD-10-CM | POA: Insufficient documentation

## 2022-02-15 DIAGNOSIS — Z1152 Encounter for screening for COVID-19: Secondary | ICD-10-CM | POA: Diagnosis not present

## 2022-02-15 DIAGNOSIS — R059 Cough, unspecified: Secondary | ICD-10-CM | POA: Diagnosis present

## 2022-02-15 DIAGNOSIS — J101 Influenza due to other identified influenza virus with other respiratory manifestations: Secondary | ICD-10-CM | POA: Insufficient documentation

## 2022-02-15 LAB — CBC WITH DIFFERENTIAL/PLATELET
Abs Immature Granulocytes: 0.04 10*3/uL (ref 0.00–0.07)
Basophils Absolute: 0 10*3/uL (ref 0.0–0.1)
Basophils Relative: 0 %
Eosinophils Absolute: 0 10*3/uL (ref 0.0–0.5)
Eosinophils Relative: 1 %
HCT: 43.4 % (ref 39.0–52.0)
Hemoglobin: 14.5 g/dL (ref 13.0–17.0)
Immature Granulocytes: 1 %
Lymphocytes Relative: 10 %
Lymphs Abs: 0.7 10*3/uL (ref 0.7–4.0)
MCH: 30.3 pg (ref 26.0–34.0)
MCHC: 33.4 g/dL (ref 30.0–36.0)
MCV: 90.6 fL (ref 80.0–100.0)
Monocytes Absolute: 1 10*3/uL (ref 0.1–1.0)
Monocytes Relative: 14 %
Neutro Abs: 5.2 10*3/uL (ref 1.7–7.7)
Neutrophils Relative %: 74 %
Platelets: 227 10*3/uL (ref 150–400)
RBC: 4.79 MIL/uL (ref 4.22–5.81)
RDW: 13.2 % (ref 11.5–15.5)
WBC: 6.9 10*3/uL (ref 4.0–10.5)
nRBC: 0 % (ref 0.0–0.2)

## 2022-02-15 LAB — BASIC METABOLIC PANEL
Anion gap: 8 (ref 5–15)
BUN: 7 mg/dL (ref 6–20)
CO2: 23 mmol/L (ref 22–32)
Calcium: 9.2 mg/dL (ref 8.9–10.3)
Chloride: 106 mmol/L (ref 98–111)
Creatinine, Ser: 1.3 mg/dL — ABNORMAL HIGH (ref 0.61–1.24)
GFR, Estimated: >60 mL/min (ref >60)
Glucose, Bld: 184 mg/dL — ABNORMAL HIGH (ref 70–99)
Potassium: 4.1 mmol/L (ref 3.5–5.1)
Sodium: 137 mmol/L (ref 135–145)

## 2022-02-15 LAB — RESP PANEL BY RT-PCR (RSV, FLU A&B, COVID)  RVPGX2
Influenza A by PCR: POSITIVE — AB
Influenza B by PCR: NEGATIVE
Resp Syncytial Virus by PCR: NEGATIVE
SARS Coronavirus 2 by RT PCR: NEGATIVE

## 2022-02-15 NOTE — ED Provider Triage Note (Signed)
  Emergency Medicine Provider Triage Evaluation Note  MRN:  [id number]  Arrival date & time: 02/15/22    Medically screening exam initiated at 3:39 AM.   CC:   Body Aches / Sweats/ Fatigue   HPI:  Charles Watts is a 51 y.o. year-old male presents to the ED with chief complaint of generalized body aches, chills, cough, headache.  Onset 1.5 days ago.  Has tried OTC meds without much relief.  History provided by patient. ROS:  -As included in HPI PE:   Vitals:   02/15/22 0336  BP: (!) 156/106  Pulse: 100  Resp: 17  Temp: 99 F (37.2 C)  SpO2: 96%    Non-toxic appearing No respiratory distress  MDM:  Based on signs and symptoms, flu is highest on my differential, followed by RSV. I've ordered labs in triage to expedite lab/diagnostic workup.  Patient was informed that the remainder of the evaluation will be completed by another provider, this initial triage assessment does not replace that evaluation, and the importance of remaining in the ED until their evaluation is complete.    Roxy Horseman, PA-C 02/15/22 [phone]

## 2022-02-15 NOTE — ED Notes (Signed)
Patient left on own accord °

## 2022-02-15 NOTE — ED Triage Notes (Signed)
Patient reports fatigue with body aches , sweats , chills and dry cough for several days .

## 2023-01-13 IMAGING — CR DG CHEST 2V
2 series · 2 of 2 positions shown · non-contrast
Comparison: 04/10/2017

CLINICAL DATA: Chest pain for 1 day

EXAM:
CHEST - 2 VIEW

[chest lat]
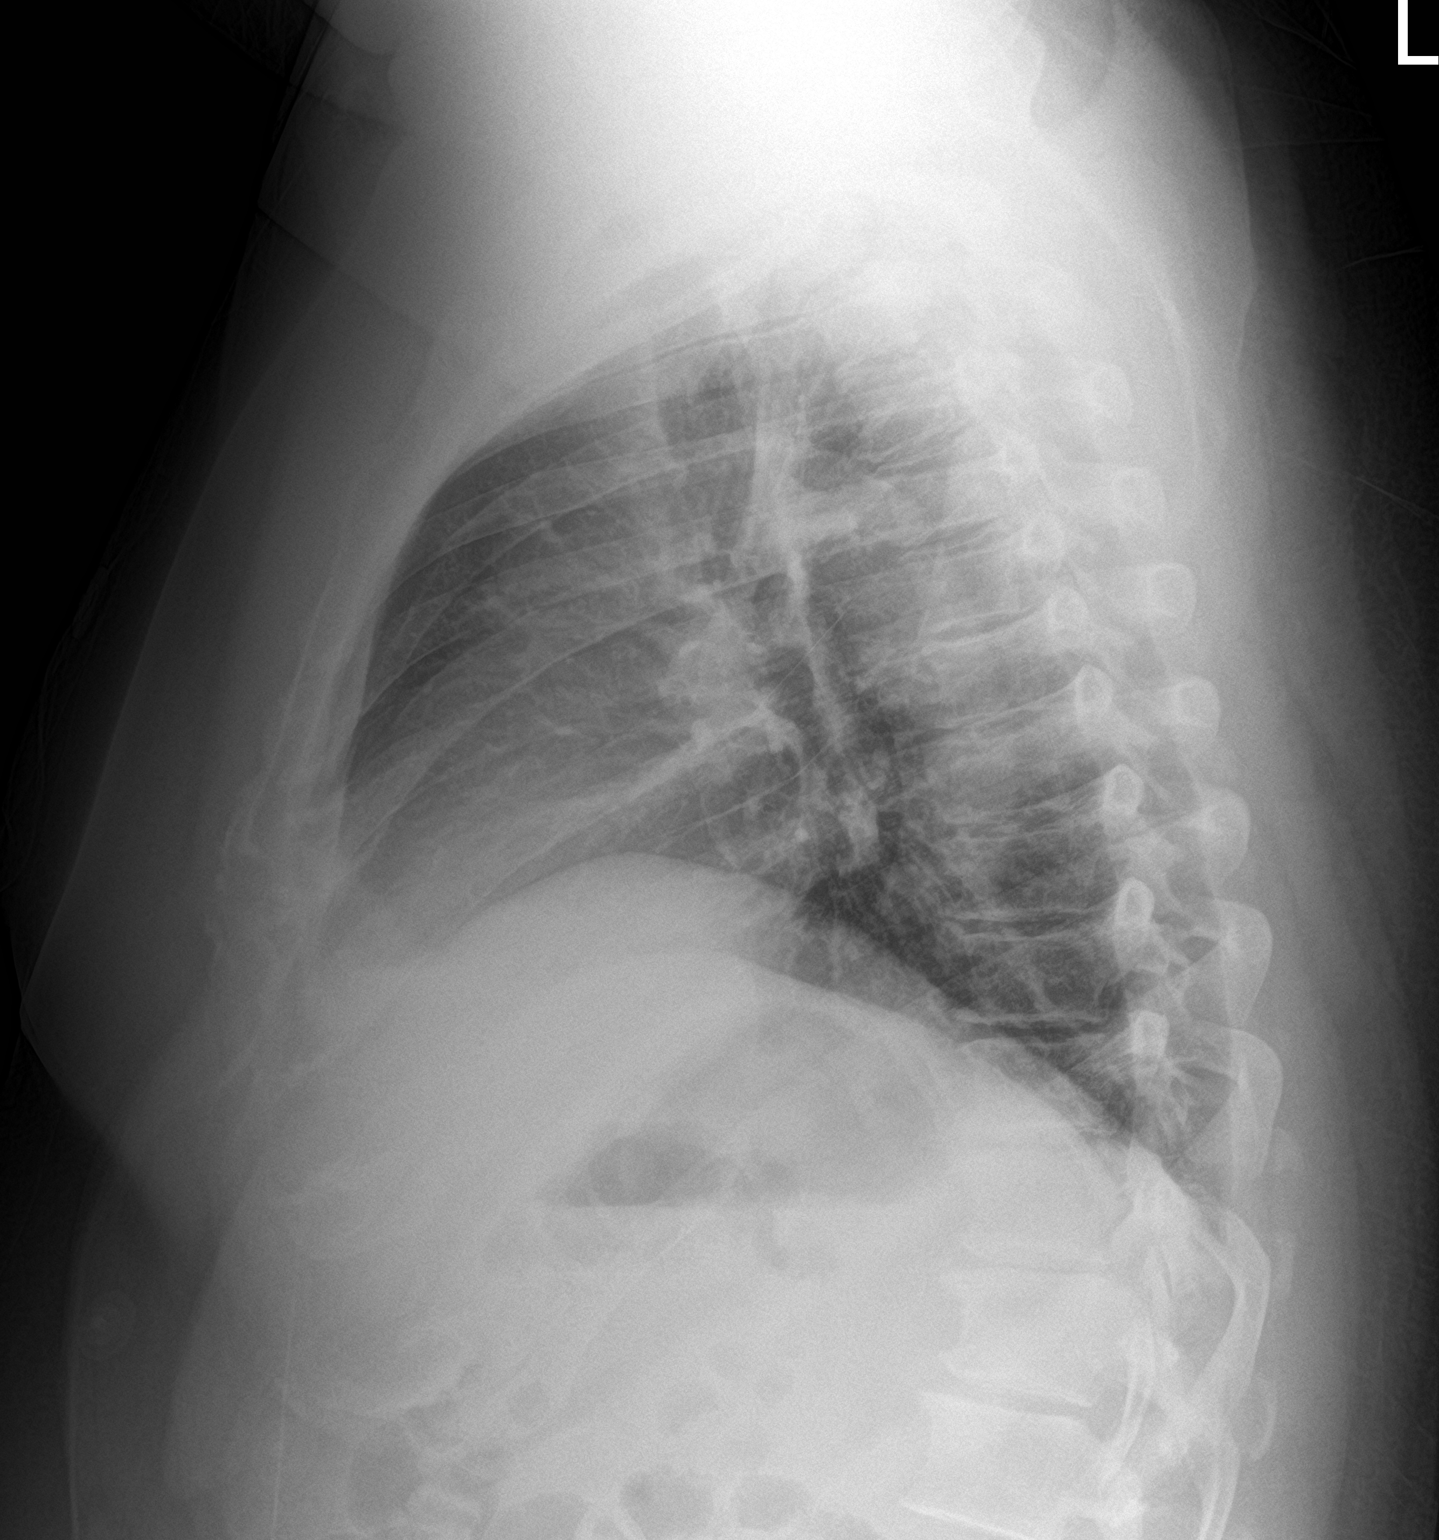

[chest pa]
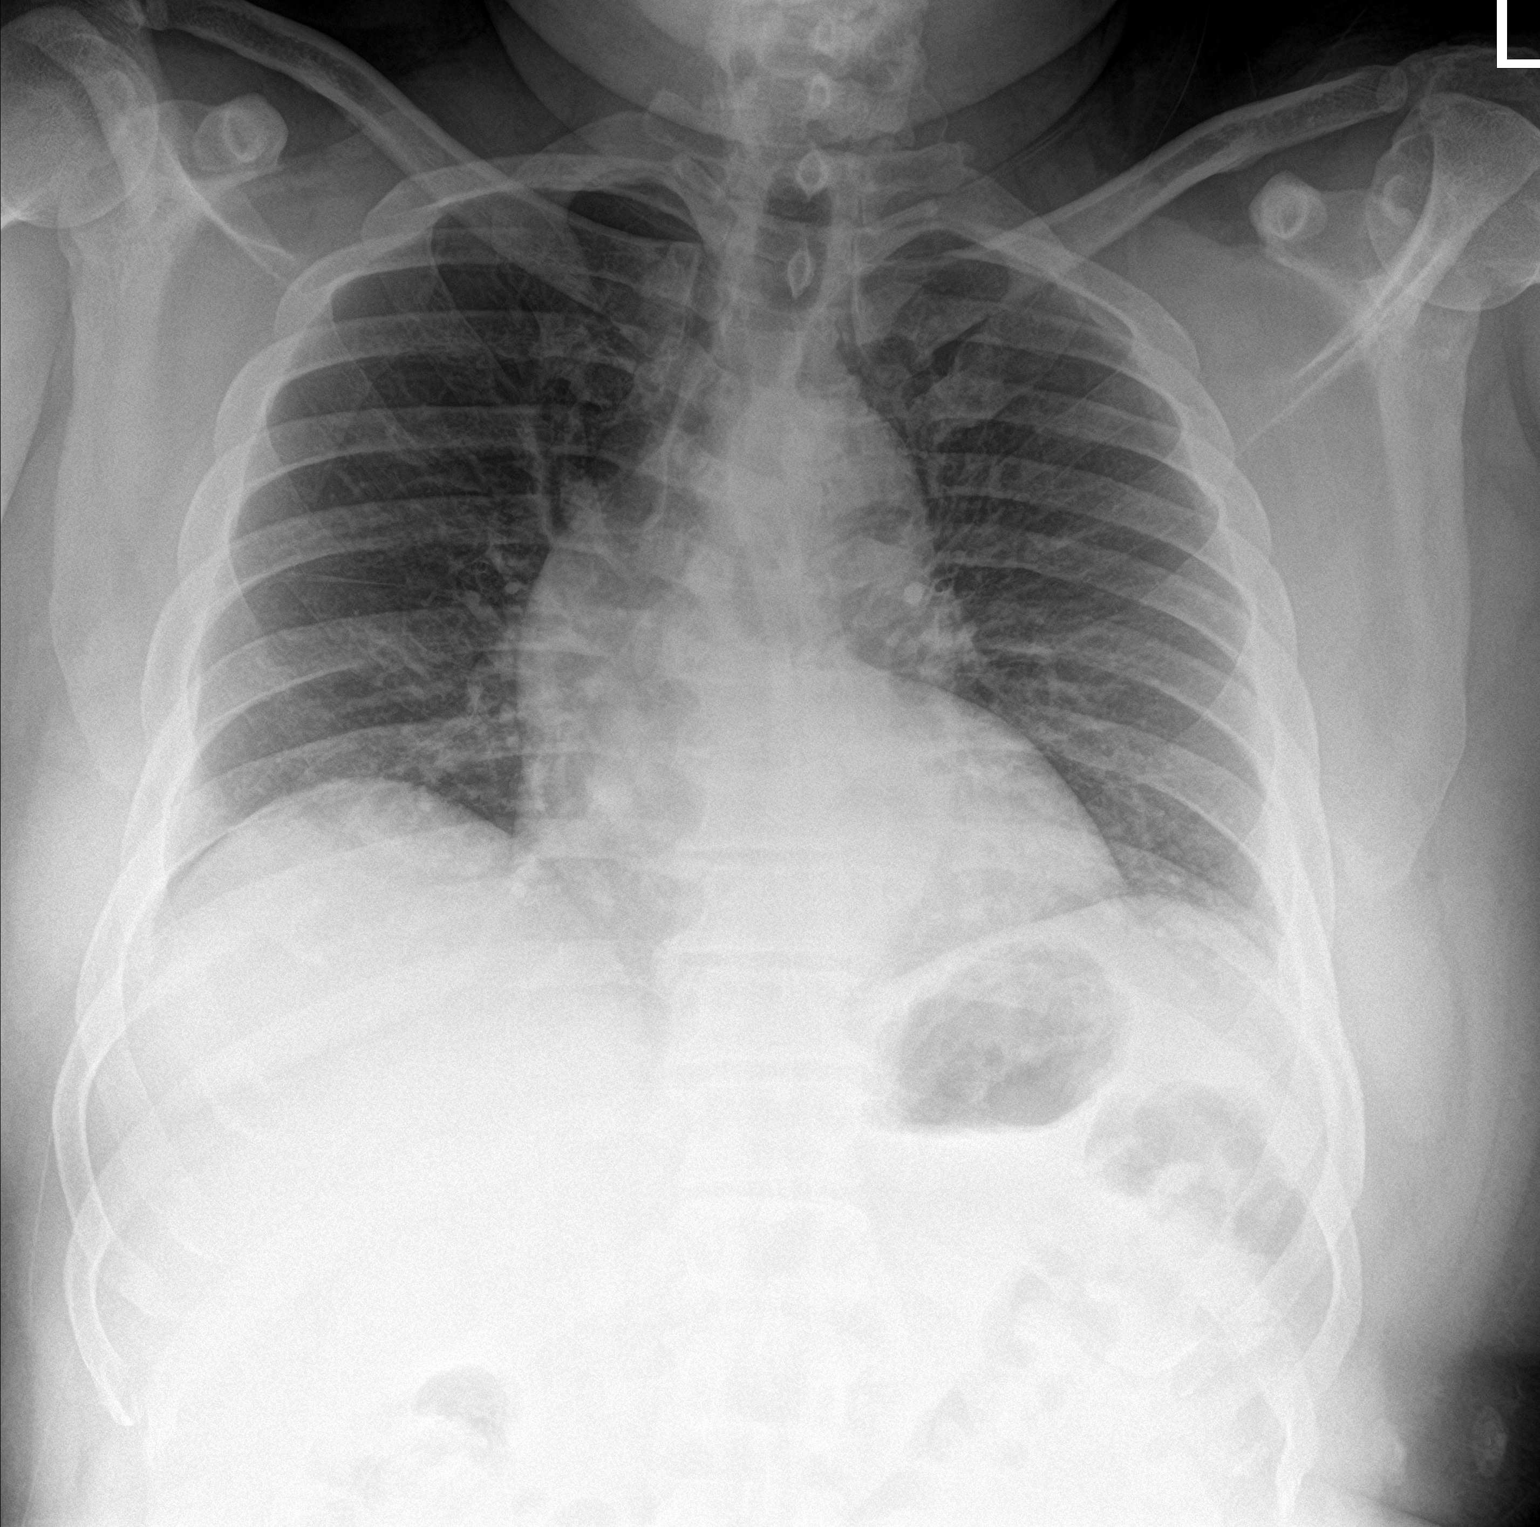

[2 of 2 positions shown; findings below may reference images not displayed]

FINDINGS: Cardiac shadow is stable. The lungs are well aerated bilaterally. No
focal infiltrate or effusion is seen. Mild degenerative changes of
the thoracic spine are noted.
IMPRESSION: No acute abnormality noted.
